# Patient Record
Sex: Male | Born: 1975 | Race: Black or African American | Hispanic: No | Marital: Single | Smoking: Never smoker
Health system: Southern US, Community
[De-identification: ages and names within clinical notes are randomized; demographics above are authoritative.]

---

## 1997-11-14 ENCOUNTER — Emergency Department (HOSPITAL_COMMUNITY): Admission: EM | Admit: 1997-11-14 | Discharge: 1997-11-14 | Payer: Self-pay | Admitting: Emergency Medicine

## 1997-11-15 ENCOUNTER — Encounter: Payer: Self-pay | Admitting: Emergency Medicine

## 1998-04-21 ENCOUNTER — Emergency Department (HOSPITAL_COMMUNITY): Admission: EM | Admit: 1998-04-21 | Discharge: 1998-04-21 | Payer: Self-pay | Admitting: Emergency Medicine

## 1999-09-14 ENCOUNTER — Emergency Department (HOSPITAL_COMMUNITY): Admission: EM | Admit: 1999-09-14 | Discharge: 1999-09-14 | Payer: Self-pay | Admitting: Emergency Medicine

## 2004-11-14 ENCOUNTER — Emergency Department (HOSPITAL_COMMUNITY): Admission: EM | Admit: 2004-11-14 | Discharge: 2004-11-15 | Payer: Self-pay | Admitting: Emergency Medicine

## 2006-12-26 ENCOUNTER — Emergency Department (HOSPITAL_COMMUNITY): Admission: EM | Admit: 2006-12-26 | Discharge: 2006-12-27 | Payer: Self-pay | Admitting: *Deleted

## 2011-05-26 ENCOUNTER — Emergency Department (HOSPITAL_COMMUNITY)
Admission: EM | Admit: 2011-05-26 | Discharge: 2011-05-26 | Disposition: A | Discharge status: Home / Self Care | Payer: Self-pay | Attending: Emergency Medicine | Admitting: Emergency Medicine

## 2011-05-26 ENCOUNTER — Encounter (HOSPITAL_COMMUNITY): Payer: Self-pay | Admitting: Family Medicine

## 2011-05-26 DIAGNOSIS — R197 Diarrhea, unspecified: Secondary | ICD-10-CM | POA: Insufficient documentation

## 2011-05-26 DIAGNOSIS — R112 Nausea with vomiting, unspecified: Secondary | ICD-10-CM | POA: Insufficient documentation

## 2011-05-26 LAB — LIPASE, BLOOD: Lipase: 18 U/L (ref 11–59)

## 2011-05-26 LAB — OCCULT BLOOD, POC DEVICE: Fecal Occult Bld: NEGATIVE

## 2011-05-26 LAB — DIFFERENTIAL
Eosinophils Absolute: 0 10*3/uL (ref 0.0–0.7)
Eosinophils Relative: 0 % (ref 0–5)
Lymphs Abs: 0.5 10*3/uL — ABNORMAL LOW (ref 0.7–4.0)
Monocytes Absolute: 0.4 10*3/uL (ref 0.1–1.0)

## 2011-05-26 LAB — COMPREHENSIVE METABOLIC PANEL
ALT: 27 U/L (ref 0–53)
BUN: 12 mg/dL (ref 6–23)
Calcium: 9 mg/dL (ref 8.4–10.5)
Creatinine, Ser: 1.02 mg/dL (ref 0.50–1.35)
GFR calc Af Amer: >90 mL/min (ref >90)
GFR calc non Af Amer: >90 mL/min (ref >90)
Glucose, Bld: 95 mg/dL (ref 70–99)
Sodium: 139 mEq/L (ref 135–145)
Total Protein: 8.2 g/dL (ref 6.0–8.3)

## 2011-05-26 LAB — CBC
HCT: 43.9 % (ref 39.0–52.0)
MCH: 28.1 pg (ref 26.0–34.0)
MCV: 83.3 fL (ref 78.0–100.0)
Platelets: 264 10*3/uL (ref 150–400)
RBC: 5.27 MIL/uL (ref 4.22–5.81)

## 2011-05-26 MED ORDER — ONDANSETRON HCL 4 MG/2ML IJ SOLN
4.0000 mg | INTRAMUSCULAR | Status: DC | PRN
  Administered 2011-05-26: 4 mg via INTRAVENOUS
  Filled 2011-05-26: qty 2

## 2011-05-26 MED ORDER — SODIUM CHLORIDE 0.9 % IV BOLUS (SEPSIS)
500.0000 mL | Freq: Once | INTRAVENOUS | Status: AC
  Administered 2011-05-26: 500 mL via INTRAVENOUS

## 2011-05-26 MED ORDER — SODIUM CHLORIDE 0.9 % IV SOLN
INTRAVENOUS | Status: DC
  Administered 2011-05-26: 15:00:00 via INTRAVENOUS

## 2011-05-26 MED ORDER — ONDANSETRON HCL 4 MG PO TABS: 4.0000 mg | ORAL_TABLET | Freq: Three times a day (TID) | ORAL | Status: AC | PRN

## 2011-05-26 MED ORDER — SODIUM CHLORIDE 0.9 % IV SOLN
80.0000 mg | Freq: Once | INTRAVENOUS | Status: AC
  Administered 2011-05-26: 80 mg via INTRAVENOUS
  Filled 2011-05-26: qty 80

## 2011-05-26 NOTE — ED Notes (Signed)
Per EMS: Pt c/o weakness and joint aches starting last night. X1 hour ago pt had 1 episode of emesis with blood.  NAD noted.

## 2011-05-26 NOTE — ED Notes (Signed)
Pt wife reports that pt s/s started with diarrhea last night, and vomiting this morning with bright red blood.  Unable to tolerate fluids. Wife reports that they're baby son was sick with vomiting and diarrhea Monday.

## 2011-05-26 NOTE — Discharge Instructions (Signed)
RESOURCE GUIDE  Dental Problems  Patients with Medicaid: [facility]                     [facility] [address]                                           [address] Phone:  [phone]                                                  Phone:  [phone]  If unable to pay or uninsured, contact:  [facility] or [facility] to become qualified for the adult dental clinic.  Chronic Pain Problems Contact [facility]  [phone] Patients need to be referred by their primary care doctor.  Insufficient Money for Medicine Contact [employer]:  call "211" or [facility] 271-5999.  No Primary Care Doctor Call [facility]  832-8000 Other agencies that provide inexpensive medical care    [facility]  [phone]    [facility]  [phone]    [facility]  [phone]    [facility] Clinic  [phone]    [facility]  [phone]    [facility]  [phone]  Psychological Services [facility]  [phone] [facility]  [phone] [facility]   [phone] (emergency services [phone])  Substance Abuse Resources [facility]  336-882-2125 [facility] 336-784-9470 [facility] 336-285-9073 [facility] 336-845-3988 Residential & Outpatient Substance Abuse Program  800-659-3381  Abuse/Neglect [place] Child Abuse Hotline (336) 641-3795 [place] Child Abuse Hotline 800-378-5315 (After Hours)  Emergency Shelter [facility] (336) 271-5985  M[facility] (336) 275-9566 [facility] (704) 372-4663  MRSA Hotline #:   832-7006    [place] [facility]  [facility] [place]     [employer]                          [facility] [address] [place]                       [address]      [address]  [place]                                                [place]                            [place] Phone:  [phone]                                   Phone:  [phone]                 Phone:  [phone]  [facility] Phone:  [phone]    [facility] Child Abuse Hotline 8488311164 (317) 840-6953 (After Hours)   Take the prescription as directed.  Increase your fluid intake (ie:  Gatoraide) for the next few days.  Eat a bland diet and advance to your regular diet slowly as you can tolerate it.   Avoid full strength juices, as well as milk and milk products until your diarrhea has resolved.   Call your regular medical doctor tomorrow to schedule a follow up appointment within the next week.  Return to the Emergency Department immediately if not improving (or even worsening) despite taking the medicines as prescribed, any black or bloody stool or vomit, if you develop a fever, or for any other concerns.

## 2011-05-26 NOTE — ED Notes (Signed)
20 gauge removed from patient's right [facility]

## 2011-05-26 NOTE — ED Provider Notes (Signed)
History     CSN: [id number]  Arrival date & time 05/26/11  1215   First MD Initiated Contact with Patient 05/26/11 1251      Chief Complaint  Patient presents with  . Hematemesis  . Nausea  . Emesis  . Diarrhea    HPI Pt was seen at 1310.  Per pt, c/o, c/o gradual onset and persistence of multiple intermittent episodes of N/V/D since last night.  Pt states he "saw blood" in his emesis after several episodes of retching today.  +child in household had same symptoms 2 days ago, dx with "GI virus" by PMD; also pt's spouse with similar symptoms.  Denies black or blood in stools, no CP/SOB, no fevers, no back pain, no abd pain.    History reviewed. No pertinent past medical history.  History reviewed. No pertinent past surgical history.  History  Substance Use Topics  . Smoking status: Not on file  . Smokeless tobacco: Not on file  . Alcohol Use: No    Review of Systems ROS: Statement: All systems negative except as marked or noted in the HPI; Constitutional: Negative for fever and chills. ; ; Eyes: Negative for eye pain, redness and discharge. ; ; ENMT: Negative for ear pain, hoarseness, nasal congestion, sinus pressure and sore throat. ; ; Cardiovascular: Negative for chest pain, palpitations, diaphoresis, dyspnea and peripheral edema. ; ; Respiratory: Negative for cough, wheezing and stridor. ; ; Gastrointestinal: +N/V/D, "blood in emesis."  Negative for abdominal pain, blood in stool, jaundice and rectal bleeding. . ; ; Genitourinary: Negative for dysuria, flank pain and hematuria. ; ; Musculoskeletal: Negative for back pain and neck pain. Negative for swelling and trauma.; ; Skin: Negative for pruritus, rash, abrasions, blisters, bruising and skin lesion.; ; Neuro: Negative for headache, lightheadedness and neck stiffness. Negative for weakness, altered level of consciousness , altered mental status, extremity weakness, paresthesias, involuntary movement, seizure and syncope.       Allergies  Review of patient's allergies indicates no known allergies.  Home Medications   Current Outpatient Rx  Name Route Sig Dispense Refill  . OVER THE COUNTER MEDICATION Oral Take 2 capsules by mouth every morning. Supplement. Muscle Ripped X.    . OVER THE COUNTER MEDICATION Oral Take 1 scoop by mouth daily. Supplement. Branched chain amino acid muscle recovery powder.      BP 123/78  Pulse 95  Temp(Src) 98.3 F (36.8 C) (Oral)  SpO2 96%  Physical Exam 1315: Physical examination:  Nursing notes reviewed; Vital signs and O2 SAT reviewed;  Constitutional: Well developed, Well nourished, Well hydrated, In no acute distress; Head:  Normocephalic, atraumatic; Eyes: EOMI, PERRL, No scleral icterus; ENMT: Mouth and pharynx normal, Mucous membranes moist; Neck: Supple, Full range of motion, No lymphadenopathy; Cardiovascular: Regular rate and rhythm, No murmur, rub, or gallop; Respiratory: Breath sounds clear & equal bilaterally, No rales, rhonchi, wheezes, or rub, Normal respiratory effort/excursion; Chest: Nontender, Movement normal; Abdomen: Soft, Nontender, Nondistended, Normal bowel sounds; Rectal exam performed w/permission of pt and ED RN chaparone present.  Anal tone normal.  Non-tender, soft brown stool in rectal vault, heme neg.  No fissures, no external hemorrhoids, no palp masses.;  Extremities: Pulses normal, No tenderness, No edema, No calf edema or asymmetry.; Neuro: AA&Ox3, Major CN grossly intact. Gait upright and steady.  No gross focal motor or sensory deficits in extremities.; Skin: Color normal, Warm, Dry, no rash.    ED Course  Procedures    MDM  MDM Reviewed: nursing  note and vitals Interpretation: labs    Results for orders placed during the hospital encounter of 05/26/11  OCCULT BLOOD, POC DEVICE      Component Value Range   Fecal Occult Bld NEGATIVE    CBC      Component Value Range   WBC 9.2  4.0 - 10.5 (K/uL)   RBC 5.27  4.22 - 5.81 (MIL/uL)    Hemoglobin 14.8  13.0 - 17.0 (g/dL)   HCT 96.0  45.4 - 09.8 (%)   MCV 83.3  78.0 - 100.0 (fL)   MCH 28.1  26.0 - 34.0 (pg)   MCHC 33.7  30.0 - 36.0 (g/dL)   RDW 11.9  14.7 - 82.9 (%)   Platelets 264  150 - 400 (K/uL)  DIFFERENTIAL      Component Value Range   Neutrophils Relative 90 (*) 43 - 77 (%)   Neutro Abs 8.3 (*) 1.7 - 7.7 (K/uL)   Lymphocytes Relative 5 (*) 12 - 46 (%)   Lymphs Abs 0.5 (*) 0.7 - 4.0 (K/uL)   Monocytes Relative 5  3 - 12 (%)   Monocytes Absolute 0.4  0.1 - 1.0 (K/uL)   Eosinophils Relative 0  0 - 5 (%)   Eosinophils Absolute 0.0  0.0 - 0.7 (K/uL)   Basophils Relative 0  0 - 1 (%)   Basophils Absolute 0.0  0.0 - 0.1 (K/uL)  COMPREHENSIVE METABOLIC PANEL      Component Value Range   Sodium 139  135 - 145 (mEq/L)   Potassium 4.5  3.5 - 5.1 (mEq/L)   Chloride 103  96 - 112 (mEq/L)   CO2 26  19 - 32 (mEq/L)   Glucose, Bld 95  70 - 99 (mg/dL)   BUN 12  6 - 23 (mg/dL)   Creatinine, Ser 5.62  0.50 - 1.35 (mg/dL)   Calcium 9.0  8.4 - 13.0 (mg/dL)   Total Protein 8.2  6.0 - 8.3 (g/dL)   Albumin 4.4  3.5 - 5.2 (g/dL)   AST 28  0 - 37 (U/L)   ALT 27  0 - 53 (U/L)   Alkaline Phosphatase 51  39 - 117 (U/L)   Total Bilirubin 0.6  0.3 - 1.2 (mg/dL)   GFR calc non Af Amer >90  >90 (mL/min)   GFR calc Af Amer >90  >90 (mL/min)  LIPASE, BLOOD      Component Value Range   Lipase 18  11 - 59 (U/L)     4:47 PM:  Feels "better" after meds and wants to go home now.  Has tol PO well while in the ED.  No N/V.  No stooling while in the ED.  No CP, no abd pain.  Abd continues benign.  VSS.  Dx testing d/w pt and family.  Questions answered.  Verb understanding, agreeable to d/c home with outpt f/u.               Laray Anger, DO 05/28/11 2148

## 2011-10-17 ENCOUNTER — Encounter (HOSPITAL_BASED_OUTPATIENT_CLINIC_OR_DEPARTMENT_OTHER): Payer: Self-pay | Admitting: *Deleted

## 2011-10-17 DIAGNOSIS — T50995A Adverse effect of other drugs, medicaments and biological substances, initial encounter: Secondary | ICD-10-CM | POA: Insufficient documentation

## 2011-10-17 DIAGNOSIS — R509 Fever, unspecified: Secondary | ICD-10-CM | POA: Insufficient documentation

## 2011-10-17 DIAGNOSIS — T887XXA Unspecified adverse effect of drug or medicament, initial encounter: Secondary | ICD-10-CM | POA: Insufficient documentation

## 2011-10-17 NOTE — ED Notes (Addendum)
C/o general weakness that started today with fevers/chills. C/o frontal h/a that started today as well. Pt. States that he took a "stamina" supplement and was concerned this is why he was feeling bad. Denies any cp/sob

## 2011-10-18 ENCOUNTER — Emergency Department (HOSPITAL_BASED_OUTPATIENT_CLINIC_OR_DEPARTMENT_OTHER)
Admission: EM | Admit: 2011-10-18 | Discharge: 2011-10-18 | Disposition: A | Discharge status: Home / Self Care | Payer: Self-pay | Attending: Emergency Medicine | Admitting: Emergency Medicine

## 2011-10-18 DIAGNOSIS — T50905A Adverse effect of unspecified drugs, medicaments and biological substances, initial encounter: Secondary | ICD-10-CM

## 2011-10-18 LAB — URINE MICROSCOPIC-ADD ON

## 2011-10-18 LAB — URINALYSIS, ROUTINE W REFLEX MICROSCOPIC
Leukocytes, UA: NEGATIVE
Nitrite: NEGATIVE
Protein, ur: 30 mg/dL — AB
Specific Gravity, Urine: 1.025 (ref 1.005–1.030)
Urobilinogen, UA: 1 mg/dL (ref 0.0–1.0)

## 2011-10-18 MED ORDER — ACETAMINOPHEN 325 MG PO TABS
650.0000 mg | ORAL_TABLET | Freq: Once | ORAL | Status: AC
  Administered 2011-10-18: 650 mg via ORAL
  Filled 2011-10-18: qty 2

## 2011-10-18 NOTE — ED Provider Notes (Signed)
History   This chart was scribed for Jay Yoder Smitty Cords, MD by Sofie Rower. The patient was seen in room MH06/MH06 and the patient's care was started at 12:27AM.     CSN: [id number]  Arrival date & time 10/17/11  2115   First MD Initiated Contact with Patient 10/18/11 0027      Chief Complaint  Patient presents with  . Weakness    (Consider location/radiation/quality/duration/timing/severity/associated sxs/prior treatment) Patient is a 36 y.o. male presenting with weakness. The history is provided by the patient. No language interpreter was used.  Weakness Primary symptoms do not include headaches, syncope, loss of consciousness, altered mental status, seizures, dizziness, visual change, paresthesias, focal weakness, loss of sensation, speech change, memory loss, fever, nausea or vomiting. The symptoms began 12 to 24 hours ago. The symptoms are unchanged. The neurological symptoms are diffuse. Context: taking a male enhancement supplement.  Additional symptoms include weakness. Additional symptoms do not include neck stiffness, pain, lower back pain, leg pain, loss of balance, photophobia, aura, hallucinations, hyperacusis, hearing loss, anxiety or irritability. Medical issues do not include seizures, cerebral vascular accident, cancer, diabetes or recent surgery. Workup history does not include MRI.  No   Jay Yoder is a 36 y.o. male  who presents to the Emergency Department complaining of  sudden, progressively worsening, weakness, onset yesterday with associated symptoms of chills, fever (99 taken at home), and headache. The pt reports that he ingested a stamina supplement yesterday evening where he began to experience sensations of fever and chills shortly thereafter.  The pt denies nasal congestion, itchy or watery eyes, diarrhea, and vomiting.   The pt does not smoke or drink alcohol.     History reviewed. No pertinent past medical history.  History reviewed. No  pertinent past surgical history.  No family history on file.  History  Substance Use Topics  . Smoking status: Never Smoker   . Smokeless tobacco: Not on file  . Alcohol Use: No      Review of Systems  Constitutional: Negative for fever and irritability.  HENT: Negative for hearing loss, ear pain, congestion, sore throat, rhinorrhea, sneezing, neck pain, neck stiffness, postnasal drip and sinus pressure.   Eyes: Negative for photophobia.  Respiratory: Negative for cough and shortness of breath.   Cardiovascular: Negative for chest pain and syncope.  Gastrointestinal: Negative for nausea and vomiting.  Skin: Negative for rash.  Neurological: Positive for weakness. Negative for dizziness, speech change, focal weakness, seizures, loss of consciousness, headaches, paresthesias and loss of balance.  Hematological: Negative for adenopathy. Does not bruise/bleed easily.  Psychiatric/Behavioral: Negative for hallucinations, memory loss, self-injury and altered mental status.  All other systems reviewed and are negative.  No tick exposure.  Started after taking some herbal male enhancement product  Allergies  Review of patient's allergies indicates no known allergies.  Home Medications   Current Outpatient Rx  Name Route Sig Dispense Refill  . ADULT MULTIVITAMIN W/MINERALS CH Oral Take 1 tablet by mouth daily.    Marland Kitchen OVER THE COUNTER MEDICATION Oral Take 1 tablet by mouth once as needed. Sexual enhancement medication      BP 131/83  Pulse 78  Temp 99.1 F (37.3 C) (Oral)  Resp 20  Ht 5\' 7"  (1.702 m)  Wt 160 lb (72.576 kg)  BMI 25.06 kg/m2  SpO2 98%  Physical Exam  Nursing note and vitals reviewed. Constitutional: He is oriented to person, place, and time. He appears well-developed and well-nourished.  HENT:  Head:  Normocephalic and atraumatic.  Right Ear: Tympanic membrane and external ear normal.  Left Ear: Tympanic membrane normal.  Nose: Nose normal.  Mouth/Throat:  Oropharynx is clear and moist. No oropharyngeal exudate.  Eyes: Conjunctivae and EOM are normal. Pupils are equal, round, and reactive to light.  Neck: Normal range of motion. Neck supple. No tracheal deviation present. No Brudzinski's sign and no Kernig's sign noted.  Cardiovascular: Normal rate, regular rhythm and normal heart sounds.   Pulmonary/Chest: Effort normal and breath sounds normal. No respiratory distress. He has no wheezes. He has no rales. He exhibits no tenderness.  Abdominal: Soft. Bowel sounds are normal. There is no tenderness.  Musculoskeletal: Normal range of motion.  Lymphadenopathy:    He has no cervical adenopathy.  Neurological: He is alert and oriented to person, place, and time. He has normal strength and normal reflexes.  Skin: Skin is warm and dry.  Psychiatric: He has a normal mood and affect. His behavior is normal.    ED Course  Procedures (including critical care time)  DIAGNOSTIC STUDIES: Oxygen Saturation is 98% on room air, normal by my interpretation.    COORDINATION OF CARE:    12:38AM- UA discussed.. Pt agrees with treatment.   Labs Reviewed - No data to display No results found.   No diagnosis found.    MDM  Medication effect.  No more using of supplements.  Hydration      I personally performed the services described in this documentation, which was scribed in my presence. The recorded information has been reviewed and considered.     Jasmine Awe, MD 10/18/11 [phone]

## 2011-10-18 NOTE — ED Notes (Signed)
Patient reports that he took stamina otc pill yesterday and since that has had congestion, chills. Low grade fever and just general feeling of malaise. Patient in no distress

## 2011-10-19 LAB — URINE CULTURE

## 2017-12-11 ENCOUNTER — Encounter (HOSPITAL_COMMUNITY): Payer: Self-pay | Admitting: *Deleted

## 2017-12-11 ENCOUNTER — Ambulatory Visit (HOSPITAL_COMMUNITY)
Admission: EM | Admit: 2017-12-11 | Discharge: 2017-12-11 | Disposition: A | Discharge status: Home / Self Care | Payer: Self-pay | Attending: Physician Assistant | Admitting: Physician Assistant

## 2017-12-11 DIAGNOSIS — M7918 Myalgia, other site: Secondary | ICD-10-CM

## 2017-12-11 MED ORDER — NAPROXEN 500 MG PO TABS: 500.0000 mg | ORAL_TABLET | Freq: Two times a day (BID) | ORAL | 0 refills | Status: AC

## 2017-12-11 MED ORDER — CYCLOBENZAPRINE HCL 10 MG PO TABS: 5.0000 mg | ORAL_TABLET | Freq: Three times a day (TID) | ORAL | 0 refills | Status: DC | PRN
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
## 2018-02-02 ENCOUNTER — Encounter (HOSPITAL_COMMUNITY): Payer: Self-pay

## 2018-02-02 DIAGNOSIS — R509 Fever, unspecified: Secondary | ICD-10-CM | POA: Diagnosis present

## 2018-02-02 DIAGNOSIS — J111 Influenza due to unidentified influenza virus with other respiratory manifestations: Secondary | ICD-10-CM | POA: Insufficient documentation

## 2018-02-02 MED ORDER — ACETAMINOPHEN 325 MG PO TABS
650.0000 mg | ORAL_TABLET | Freq: Once | ORAL | Status: AC | PRN
  Administered 2018-02-02: 650 mg via ORAL
  Filled 2018-02-02: qty 2

## 2018-02-02 NOTE — ED Triage Notes (Signed)
Pt complains of a fever for two days, he states that he vomited yesterday but not today

## 2018-02-03 ENCOUNTER — Emergency Department (HOSPITAL_COMMUNITY): Payer: Medicaid Other

## 2018-02-03 ENCOUNTER — Emergency Department (HOSPITAL_COMMUNITY)
Admission: EM | Admit: 2018-02-03 | Discharge: 2018-02-03 | Disposition: A | Discharge status: Home / Self Care | Payer: Medicaid Other | Attending: Emergency Medicine | Admitting: Emergency Medicine

## 2018-02-03 ENCOUNTER — Other Ambulatory Visit: Payer: Self-pay

## 2018-02-03 DIAGNOSIS — J111 Influenza due to unidentified influenza virus with other respiratory manifestations: Secondary | ICD-10-CM

## 2018-02-03 LAB — INFLUENZA PANEL BY PCR (TYPE A & B)
INFLBPCR: NEGATIVE
Influenza A By PCR: POSITIVE — AB

## 2018-02-03 MED ORDER — ONDANSETRON 4 MG PO TBDP
4.0000 mg | ORAL_TABLET | Freq: Once | ORAL | Status: AC
  Administered 2018-02-03: 4 mg via ORAL
  Filled 2018-02-03: qty 1

## 2018-02-03 MED ORDER — ACETAMINOPHEN 325 MG PO TABS: 650.0000 mg | ORAL_TABLET | Freq: Once | ORAL | Status: DC

## 2018-02-03 MED ORDER — IBUPROFEN 800 MG PO TABS
800.0000 mg | ORAL_TABLET | Freq: Once | ORAL | Status: AC
Start: 2018-02-03 — End: 2018-02-03
  Administered 2018-02-03: 800 mg via ORAL
  Filled 2018-02-03: qty 1

## 2018-02-03 MED ORDER — ONDANSETRON 4 MG PO TBDP: 4.0000 mg | ORAL_TABLET | Freq: Three times a day (TID) | ORAL | 0 refills | Status: DC | PRN

## 2018-02-03 MED ORDER — OSELTAMIVIR PHOSPHATE 75 MG PO CAPS: 75.0000 mg | ORAL_CAPSULE | Freq: Two times a day (BID) | ORAL | 0 refills | Status: DC

## 2018-02-03 NOTE — ED Notes (Signed)
Patient provided cup of water and instructed to notify RN if feeling nauseous/vomiting.

## 2018-02-03 NOTE — ED Provider Notes (Signed)
[facility]-EMERGENCY DEPT Provider Note   CSN: [id number] Arrival date & time: 02/02/18  2107     History   Chief Complaint Chief Complaint  Patient presents with  . Fever    HPI Jay Yoder is a 42 y.o. male.  Patient presents with a 1 day history of diffuse body aches, headache, one episode of vomiting, dry cough, runny nose, headache and body aches.  He is concerned that he could have the flu.  He does not get a flu shot.  He has been using Alka-Seltzer and Mucinex at home without relief.  He has had a poor appetite today.  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  He complains of headache and body aches.  Denies chest pain, shortness of breath, abdominal pain.  No further vomiting.  No chronic medical problems or regular medication use.  The history is provided by the patient.  Fever   Associated symptoms include vomiting, congestion, headaches and cough.    History reviewed. No pertinent past medical history.  There are no active problems to display for this patient.   History reviewed. No pertinent surgical history.      Home Medications    Prior to Admission medications   Medication Sig Start Date End Date Taking? Authorizing Provider  cyclobenzaprine (FLEXERIL) 10 MG tablet Take 0.5-1 tablets (5-10 mg total) by mouth 3 (three) times daily as needed for muscle spasms. 12/11/17   Ofilia Neaslark, Michael L, PA-C    Family History Family History  Problem Relation Age of Onset  . Healthy Mother   . Healthy Father   . Hypertension Sister     Social History Social History   Tobacco Use  . Smoking status: Never Smoker  . Smokeless tobacco: Never Used  Substance Use Topics  . Alcohol use: No  . Drug use: No     Allergies   Patient has no known allergies.   Review of Systems Review of Systems  Constitutional: Positive for activity change, appetite change, chills and fever.  HENT: Positive for congestion and  rhinorrhea.   Eyes: Negative for visual disturbance.  Respiratory: Positive for cough. Negative for chest tightness.   Gastrointestinal: Positive for nausea and vomiting. Negative for abdominal pain.  Genitourinary: Negative for dysuria, hematuria and urgency.  Musculoskeletal: Positive for arthralgias and myalgias.  Skin: Negative for rash.  Neurological: Positive for weakness and headaches.    all other systems are negative except as noted in the HPI and PMH.    Physical Exam Updated Vital Signs BP (!) 145/91 (BP Location: Left Arm)   Pulse (!) 125   Temp (!) 101.8 F (38.8 C) (Oral)   Resp (!) 22   Ht 5\' 7"  (1.702 m)   Wt 80.3 kg   SpO2 95%   BMI 27.72 kg/m   Physical Exam Vitals signs and nursing note reviewed.  Constitutional:      General: He is not in acute distress.    Appearance: He is well-developed.     Comments: Ill-appearing but nontoxic  HENT:     Head: Normocephalic and atraumatic.     Mouth/Throat:     Mouth: Mucous membranes are moist.     Pharynx: No oropharyngeal exudate.  Eyes:     Conjunctiva/sclera: Conjunctivae normal.     Pupils: Pupils are equal, round, and reactive to light.  Neck:     Musculoskeletal: Normal range of motion and neck supple. No neck rigidity.  Comments: No meningismus. Cardiovascular:     Rate and Rhythm: Regular rhythm. Tachycardia present.     Heart sounds: Normal heart sounds. No murmur.  Pulmonary:     Effort: Pulmonary effort is normal. No respiratory distress.     Breath sounds: Normal breath sounds. No wheezing.  Abdominal:     Palpations: Abdomen is soft.     Tenderness: There is no abdominal tenderness. There is no guarding or rebound.  Musculoskeletal: Normal range of motion.        General: No tenderness.  Skin:    General: Skin is warm.  Neurological:     Mental Status: He is alert and oriented to person, place, and time.     Cranial Nerves: No cranial nerve deficit.     Motor: No abnormal muscle tone.      Coordination: Coordination normal.     Comments: No ataxia on finger to nose bilaterally. No pronator drift. 5/5 strength throughout. CN 2-12 intact.Equal grip strength. Sensation intact.   Psychiatric:        Behavior: Behavior normal.      ED Treatments / Results  Labs (all labs ordered are listed, but only abnormal results are displayed) Labs Reviewed  INFLUENZA PANEL BY PCR (TYPE A & B)    EKG None  Radiology No results found.  Procedures Procedures (including critical care time)  Medications Ordered in ED Medications  ondansetron (ZOFRAN-ODT) disintegrating tablet 4 mg (has no administration in time range)  ibuprofen (ADVIL,MOTRIN) tablet 800 mg (has no administration in time range)  acetaminophen (TYLENOL) tablet 650 mg (650 mg Oral Given 02/02/18 2248)     Initial Impression / Assessment and Plan / ED Course  I have reviewed the triage vital signs and the nursing notes.  Pertinent labs & imaging results that were available during my care of the patient were reviewed by me and considered in my medical decision making (see chart for details).    2 days of headache, body aches, fever, cough, vomiting.  Suspect influenza.  Patient tachypneic, tachycardic and febrile.  Patient clinically has influenza.  He has no meningismus.  His chest x-ray is negative.  He is tolerating p.o. fluids.  He is given antipyretics, hydration.  Risks and benefits of Tamiflu discussed and accepted by patient.  Discussed oral hydration at home, antipyretics, PCP follow-up, return precautions discussed.  BP 123/73 (BP Location: Right Arm)   Pulse 99   Temp 99.5 F (37.5 C) (Oral)   Resp 20   Ht 5\' 7"  (1.702 m)   Wt 80.3 kg   SpO2 97%   BMI 27.72 kg/m   Final Clinical Impressions(s) / ED Diagnoses   Final diagnoses:  Influenza    ED Discharge Orders    None       Xareni Kelch, Jeannett SeniorStephen, MD 02/03/18 [phone]

## 2018-02-03 NOTE — Discharge Instructions (Addendum)
Keep yourself hydrated.  Use Tylenol or ibuprofen as needed for aches and fevers.  Follow-up with your doctor.  Return to the ED with new or worsening symptoms.

## 2018-02-03 NOTE — ED Notes (Signed)
Patient ambulated to x-ray.

## 2018-02-06 ENCOUNTER — Other Ambulatory Visit: Payer: Self-pay

## 2018-02-06 ENCOUNTER — Encounter (HOSPITAL_COMMUNITY): Payer: Self-pay | Admitting: Emergency Medicine

## 2018-02-06 ENCOUNTER — Emergency Department (HOSPITAL_COMMUNITY)
Admission: EM | Admit: 2018-02-06 | Discharge: 2018-02-06 | Disposition: A | Discharge status: Home / Self Care | Payer: Medicaid Other | Attending: Emergency Medicine | Admitting: Emergency Medicine

## 2018-02-06 DIAGNOSIS — J101 Influenza due to other identified influenza virus with other respiratory manifestations: Secondary | ICD-10-CM | POA: Diagnosis not present

## 2018-02-06 DIAGNOSIS — R531 Weakness: Secondary | ICD-10-CM | POA: Diagnosis present

## 2018-02-06 DIAGNOSIS — R739 Hyperglycemia, unspecified: Secondary | ICD-10-CM | POA: Diagnosis not present

## 2018-02-06 DIAGNOSIS — R030 Elevated blood-pressure reading, without diagnosis of hypertension: Secondary | ICD-10-CM | POA: Diagnosis not present

## 2018-02-06 DIAGNOSIS — R209 Unspecified disturbances of skin sensation: Secondary | ICD-10-CM

## 2018-02-06 LAB — COMPREHENSIVE METABOLIC PANEL
ALK PHOS: 36 U/L — AB (ref 38–126)
ALT: 43 U/L (ref 0–44)
AST: 43 U/L — ABNORMAL HIGH (ref 15–41)
Albumin: 3.7 g/dL (ref 3.5–5.0)
Anion gap: 12 (ref 5–15)
BUN: 9 mg/dL (ref 6–20)
CALCIUM: 8.3 mg/dL — AB (ref 8.9–10.3)
CO2: 22 mmol/L (ref 22–32)
CREATININE: 1.16 mg/dL (ref 0.61–1.24)
Chloride: 102 mmol/L (ref 98–111)
GFR calc Af Amer: >60 mL/min (ref >60)
GFR calc non Af Amer: >60 mL/min (ref >60)
Glucose, Bld: 130 mg/dL — ABNORMAL HIGH (ref 70–99)
Potassium: 3.8 mmol/L (ref 3.5–5.1)
Sodium: 136 mmol/L (ref 135–145)
TOTAL PROTEIN: 7.2 g/dL (ref 6.5–8.1)
Total Bilirubin: 1.5 mg/dL — ABNORMAL HIGH (ref 0.3–1.2)

## 2018-02-06 LAB — I-STAT CHEM 8, ED
BUN: 12 mg/dL (ref 6–20)
Calcium, Ion: 0.97 mmol/L — ABNORMAL LOW (ref 1.15–1.40)
Chloride: 101 mmol/L (ref 98–111)
Creatinine, Ser: 1 mg/dL (ref 0.61–1.24)
Glucose, Bld: 128 mg/dL — ABNORMAL HIGH (ref 70–99)
HCT: 46 % (ref 39.0–52.0)
Hemoglobin: 15.6 g/dL (ref 13.0–17.0)
Potassium: 3.6 mmol/L (ref 3.5–5.1)
Sodium: 136 mmol/L (ref 135–145)
TCO2: 28 mmol/L (ref 22–32)

## 2018-02-06 LAB — CBC
HCT: 45 % (ref 39.0–52.0)
Hemoglobin: 14.5 g/dL (ref 13.0–17.0)
MCH: 27.2 pg (ref 26.0–34.0)
MCHC: 32.2 g/dL (ref 30.0–36.0)
MCV: 84.3 fL (ref 80.0–100.0)
Platelets: 255 10*3/uL (ref 150–400)
RBC: 5.34 MIL/uL (ref 4.22–5.81)
RDW: 12.5 % (ref 11.5–15.5)
WBC: 4 10*3/uL (ref 4.0–10.5)
nRBC: 0 % (ref 0.0–0.2)

## 2018-02-06 LAB — DIFFERENTIAL
Abs Immature Granulocytes: 0.01 10*3/uL (ref 0.00–0.07)
Basophils Absolute: 0 10*3/uL (ref 0.0–0.1)
Basophils Relative: 1 %
Eosinophils Absolute: 0 10*3/uL (ref 0.0–0.5)
Eosinophils Relative: 0 %
Immature Granulocytes: 0 %
LYMPHS ABS: 1.5 10*3/uL (ref 0.7–4.0)
Lymphocytes Relative: 37 %
Monocytes Absolute: 0.5 10*3/uL (ref 0.1–1.0)
Monocytes Relative: 13 %
Neutro Abs: 1.9 10*3/uL (ref 1.7–7.7)
Neutrophils Relative %: 49 %

## 2018-02-06 MED ORDER — SODIUM CHLORIDE 0.9 % IV BOLUS
1000.0000 mL | Freq: Once | INTRAVENOUS | Status: AC
  Administered 2018-02-06: 1000 mL via INTRAVENOUS

## 2018-02-06 NOTE — ED Triage Notes (Addendum)
Pt reports numbness and weakness to L arm and bilateral legs x 2 hours.  No arm drift.  No leg drift.  Diagnosed with flu on Thursday.  Ambulatory to triage with steady gait.  No slurred speech.  No neuro deficits noted on triage exam.

## 2018-02-06 NOTE — ED Provider Notes (Signed)
[facility] Note   CSN: [id number] Arrival date & time: 02/06/18  0244     History   Chief Complaint Chief Complaint  Patient presents with  . Numbness  . Weakness    HPI Jay Yoder is a 42 y.o. male who was diagnosed with Influenza A on 02/03/2018. The patient is currently taking Tamifllu. The patient states that tonight he got "a sensation in my legs like they were weak or numb." He states  That it occurred from the knees down on both sides and is unsure of how long it lasted. He thinks that he "might have had a slight twinge of numbness in the left hand for a second" He denies any other neurologic symptoms. He denies actual weakness or inability to move and he has no symptoms at this time.  HPI  History reviewed. No pertinent past medical history.  There are no active problems to display for this patient.   History reviewed. No pertinent surgical history.      Home Medications    Prior to Admission medications   Medication Sig Start Date End Date Taking? Authorizing Provider  cyclobenzaprine (FLEXERIL) 10 MG tablet Take 0.5-1 tablets (5-10 mg total) by mouth 3 (three) times daily as needed for muscle spasms. Patient not taking: Reported on 02/03/2018 12/11/17   Ofilia Neaslark, Michael L, PA-C  ondansetron (ZOFRAN ODT) 4 MG disintegrating tablet Take 1 tablet (4 mg total) by mouth every 8 (eight) hours as needed for nausea or vomiting. 02/03/18   Rancour, Jeannett SeniorStephen, MD  oseltamivir (TAMIFLU) 75 MG capsule Take 1 capsule (75 mg total) by mouth every 12 (twelve) hours. 02/03/18   Glynn Octaveancour, Stephen, MD    Family History Family History  Problem Relation Age of Onset  . Healthy Mother   . Healthy Father   . Hypertension Sister     Social History Social History   Tobacco Use  . Smoking status: Never Smoker  . Smokeless tobacco: Never Used  Substance Use Topics  . Alcohol use: No  . Drug use: No     Allergies     Patient has no known allergies.   Review of Systems Review of Systems Ten systems reviewed and are negative for acute change, except as noted in the HPI.    Physical Exam Updated Vital Signs BP (!) 152/105   Pulse 97   Temp 98.3 F (36.8 C) (Oral)   Resp 16   SpO2 94%   Physical Exam Vitals signs and nursing note reviewed.  Constitutional:      General: He is not in acute distress.    Appearance: He is well-developed. He is not diaphoretic.  HENT:     Head: Normocephalic and atraumatic.  Eyes:     General: No scleral icterus.    Conjunctiva/sclera: Conjunctivae normal.  Neck:     Musculoskeletal: Normal range of motion and neck supple.  Cardiovascular:     Rate and Rhythm: Normal rate and regular rhythm.     Heart sounds: Normal heart sounds.  Pulmonary:     Effort: Pulmonary effort is normal. No respiratory distress.     Breath sounds: Normal breath sounds.  Abdominal:     Palpations: Abdomen is soft.     Tenderness: There is no abdominal tenderness.  Skin:    General: Skin is warm and dry.  Neurological:     Mental Status: He is alert.     Deep Tendon Reflexes:     Reflex  Scores:      Patellar reflexes are 2+ on the right side and 2+ on the left side.      Achilles reflexes are 2+ on the right side and 2+ on the left side.    Comments: Speech is clear and goal oriented, follows commands Major Cranial nerves without deficit, no facial droop Normal strength in upper and lower extremities bilaterally including dorsiflexion and plantar flexion, strong and equal grip strength Sensation normal to light and sharp touch Moves extremities without ataxia, coordination intact Normal finger to nose and rapid alternating movements Neg romberg, no pronator drift Normal gait Normal heel-shin and balance   Psychiatric:        Behavior: Behavior normal.      ED Treatments / Results  Labs (all labs ordered are listed, but only abnormal results are displayed) Labs  Reviewed  CBC  DIFFERENTIAL  COMPREHENSIVE METABOLIC PANEL  URINALYSIS, ROUTINE W REFLEX MICROSCOPIC  I-STAT CHEM 8, ED    EKG EKG Interpretation  Date/Time:  Monday February 06 2018 02:53:55 EST Ventricular Rate:  100 PR Interval:  122 QRS Duration: 78 QT Interval:  348 QTC Calculation: 448 R Axis:   29 Text Interpretation:  Normal sinus rhythm Nonspecific ST abnormality Abnormal ECG Confirmed by Zadie RhineWickline, Donald ([id number]) on 02/06/2018 3:12:06 AM   Radiology No results found.  Procedures Procedures (including critical care time)  Medications Ordered in ED Medications  sodium chloride 0.9 % bolus 1,000 mL (has no administration in time range)     Initial Impression / Assessment and Plan / ED Course  I have reviewed the triage vital signs and the nursing notes.  Pertinent labs & imaging results that were available during my care of the patient were reviewed by me and considered in my medical decision making (see chart for details).    42 year old male with recent diagnosis of influenza A.  Lab work-up shows mildly elevated hyperglycemia, he has an elevated blood pressure reading here today.  Patient has significant difficulty scribing his symptoms however with bilateral stocking distribution of potential numbness although it is difficult to gauge his actual sensation I have very low suspicion for TIA.  Patient has reflexes so Guyon Barr syndrome is highly unlikely.  I doubt any emergent cause of his abnormal sensations today.  Labs are reviewed and patient appears appropriate for discharge at this time.  Final Clinical Impressions(s) / ED Diagnoses   Final diagnoses:  Influenza A  Abnormal sensation of leg  Hyperglycemia  Elevated blood pressure reading    ED Discharge Orders    None       Arthor CaptainHarris, Shloma Roggenkamp, PA-C 02/06/18 [id number]    Zadie RhineWickline, Donald, MD 02/06/18 [phone]

## 2018-02-06 NOTE — Discharge Instructions (Addendum)
Follow up with a primary cared doctor for recheck of your blood sugar and blood pressure after your flu symptoms have resolved. Return to the emergency department if you experience changes in vision, difficulty with speech or swallowing, facial droop, weakness of one side of your body, inability to walk, foot drop or any new or concerning symptoms.

## 2018-04-17 ENCOUNTER — Other Ambulatory Visit: Payer: Self-pay

## 2018-04-17 ENCOUNTER — Ambulatory Visit (HOSPITAL_COMMUNITY)
Admission: EM | Admit: 2018-04-17 | Discharge: 2018-04-17 | Disposition: A | Discharge status: Home / Self Care | Payer: Medicaid Other | Attending: Emergency Medicine | Admitting: Emergency Medicine

## 2018-04-17 ENCOUNTER — Encounter (HOSPITAL_COMMUNITY): Payer: Self-pay

## 2018-04-17 DIAGNOSIS — J321 Chronic frontal sinusitis: Secondary | ICD-10-CM | POA: Diagnosis not present

## 2018-04-17 DIAGNOSIS — R05 Cough: Secondary | ICD-10-CM

## 2018-04-17 DIAGNOSIS — R059 Cough, unspecified: Secondary | ICD-10-CM

## 2018-04-17 MED ORDER — PREDNISONE 10 MG (21) PO TBPK: ORAL_TABLET | Freq: Every day | ORAL | 0 refills | Status: DC

## 2018-04-17 MED ORDER — DEXTROMETHORPHAN HBR 15 MG/5ML PO SYRP: 10.0000 mL | ORAL_SOLUTION | Freq: Four times a day (QID) | ORAL | 0 refills | Status: DC | PRN

## 2018-04-17 NOTE — ED Provider Notes (Signed)
[facility]    CSN: [id number] Arrival date & time: 04/17/18  0830     History   Chief Complaint Chief Complaint  Patient presents with  . Cough    HPI Jay Yoder is a 43 y.o. male.   Cough for 2 weeks since being tested positive for flu. State that he is feeling better since then except the cough is not going away. Also noticed some nasal pressure and clear drainage. No frontal pain or headache. No n/v/d has not taken anything pta      History reviewed. No pertinent past medical history.  There are no active problems to display for this patient.   History reviewed. No pertinent surgical history.     Home Medications    Prior to Admission medications   Medication Sig Start Date End Date Taking? Authorizing Provider  cyclobenzaprine (FLEXERIL) 10 MG tablet Take 0.5-1 tablets (5-10 mg total) by mouth 3 (three) times daily as needed for muscle spasms. Patient not taking: Reported on 02/03/2018 12/11/17   Ofilia Neas, PA-C  dextromethorphan 15 MG/5ML syrup Take 10 mLs (30 mg total) by mouth 4 (four) times daily as needed for cough. 04/17/18   Coralyn Mark, NP  ondansetron (ZOFRAN ODT) 4 MG disintegrating tablet Take 1 tablet (4 mg total) by mouth every 8 (eight) hours as needed for nausea or vomiting. 02/03/18   Rancour, Jeannett Senior, MD  oseltamivir (TAMIFLU) 75 MG capsule Take 1 capsule (75 mg total) by mouth every 12 (twelve) hours. 02/03/18   Rancour, Jeannett Senior, MD  predniSONE (STERAPRED UNI-PAK 21 TAB) 10 MG (21) TBPK tablet Take by mouth daily. Take 6 tabs by mouth daily  for 2 days, then 5 tabs for 2 days, then 4 tabs for 2 days, then 3 tabs for 2 days, 2 tabs for 2 days, then 1 tab by mouth daily for 2 days 04/17/18   Coralyn Mark, NP    Family History Family History  Problem Relation Age of Onset  . Healthy Mother   . Healthy Father   . Hypertension Sister     Social History Social History   Tobacco Use  . Smoking status: Never  Smoker  . Smokeless tobacco: Never Used  Substance Use Topics  . Alcohol use: No  . Drug use: No     Allergies   Patient has no known allergies.   Review of Systems Review of Systems  Constitutional: Negative.   HENT: Positive for postnasal drip and sinus pressure.   Eyes: Negative.   Respiratory: Positive for cough.   Gastrointestinal: Negative.   Genitourinary: Negative.   Musculoskeletal: Negative.   Skin: Negative.   Neurological: Negative.      Physical Exam Triage Vital Signs ED Triage Vitals [04/17/18 0930]  Enc Vitals Group     BP      Pulse      Resp      Temp      Temp src      SpO2      Weight 175 lb (79.4 kg)     Height      Head Circumference      Peak Flow      Pain Score 5     Pain Loc      Pain Edu?      Excl. in GC?    No data found.  Updated Vital Signs Wt 175 lb (79.4 kg)   BMI 27.41 kg/m   Visual Acuity  Physical Exam HENT:     Right Ear: Tympanic membrane normal.     Left Ear: Tympanic membrane normal.     Nose: Congestion present.     Comments: Clear post nasal drip  Eyes:     Pupils: Pupils are equal, round, and reactive to light.  Neck:     Musculoskeletal: Normal range of motion.  Cardiovascular:     Rate and Rhythm: Normal rate.  Pulmonary:     Effort: Pulmonary effort is normal.  Abdominal:     General: Abdomen is flat.  Skin:    General: Skin is warm.  Neurological:     General: No focal deficit present.     Mental Status: He is alert.      UC Treatments / Results  Labs (all labs ordered are listed, but only abnormal results are displayed) Labs Reviewed - No data to display  EKG None  Radiology No results found.  Procedures Procedures (including critical care time)  Medications Ordered in UC Medications - No data to display  Initial Impression / Assessment and Plan / UC Course  I have reviewed the triage vital signs and the nursing notes.  Pertinent labs & imaging results that were  available during my care of the patient were reviewed by me and considered in my medical decision making (see chart for details).     Take cough meds as needed  Take zyrtec daily  Stay hydrated   Final Clinical Impressions(s) / UC Diagnoses   Final diagnoses:  Cough  Chronic frontal sinusitis     Discharge Instructions     Take cough meds as needed  Take zyrtec daily  Stay hydrated     ED Prescriptions    Medication Sig Dispense Auth. Provider   predniSONE (STERAPRED UNI-PAK 21 TAB) 10 MG (21) TBPK tablet Take by mouth daily. Take 6 tabs by mouth daily  for 2 days, then 5 tabs for 2 days, then 4 tabs for 2 days, then 3 tabs for 2 days, 2 tabs for 2 days, then 1 tab by mouth daily for 2 days 42 tablet Maple Mirza L, NP   dextromethorphan 15 MG/5ML syrup Take 10 mLs (30 mg total) by mouth 4 (four) times daily as needed for cough. 120 mL Coralyn Mark, NP     Controlled Substance Prescriptions [place] Controlled Substance Registry consulted? Not Applicable   Coralyn Mark, NP 04/17/18 [phone]

## 2018-04-17 NOTE — ED Triage Notes (Signed)
Pt cc nasal congestion and cough x 3 weeks or more.

## 2018-04-17 NOTE — Discharge Instructions (Addendum)
Take cough meds as needed  Take zyrtec daily  Stay hydrated

## 2018-04-26 ENCOUNTER — Encounter (HOSPITAL_COMMUNITY): Payer: Self-pay | Admitting: Emergency Medicine

## 2018-04-26 ENCOUNTER — Other Ambulatory Visit: Payer: Self-pay

## 2018-04-26 ENCOUNTER — Ambulatory Visit (HOSPITAL_COMMUNITY)
Admission: EM | Admit: 2018-04-26 | Discharge: 2018-04-26 | Disposition: A | Discharge status: Home / Self Care | Payer: Medicaid Other | Attending: Family Medicine | Admitting: Family Medicine

## 2018-04-26 DIAGNOSIS — K1379 Other lesions of oral mucosa: Secondary | ICD-10-CM

## 2018-04-26 MED ORDER — PENICILLIN V POTASSIUM 500 MG PO TABS: 500.0000 mg | ORAL_TABLET | Freq: Three times a day (TID) | ORAL | 0 refills | Status: DC

## 2018-04-26 NOTE — ED Triage Notes (Signed)
Pt c/o swelling in his gums woke up this morning with it. No tooth pain.

## 2018-04-27 NOTE — ED Provider Notes (Signed)
[facility]   [id number] 04/26/18 Arrival Time: 1115  ASSESSMENT & PLAN:  1. Mucocele of mouth    No sign of abscess requiring I&D at this time. Discussed.  Discussed this is very likely a mucocele and should resolve gradually. Given Rx for Veetid to begin taking if he notes increased swelling and pain over the next 48 hours.  Meds ordered this encounter  Medications  . penicillin v potassium (VEETID) 500 MG tablet    Sig: Take 1 tablet (500 mg total) by mouth 3 (three) times daily.    Dispense:  30 tablet    Refill:  0   May f/u here as needed.  Reviewed expectations re: course of current medical issues. Questions answered. Outlined signs and symptoms indicating need for more acute intervention. Patient verbalized understanding. After Visit Summary given.   SUBJECTIVE:  Jay Yoder is a 43 y.o. male who reports noticing and area of swelling on his inner lower left gum this morning. No pain. No injury to area. Fever: absent. Tolerating PO intake; no pain with chewing. Normal swallowing. He does not see a dentist regularly. No neck swelling or pain. No home/OTC tx. No h/o similar. No specific aggravating or alleviating factors reported.  ROS: As per HPI.  OBJECTIVE: Vitals:   04/26/18 1155  BP: (!) 134/93  Pulse: 98  Resp: 16  Temp: 98.8 F (37.1 C)  SpO2: 97%    General appearance: alert; no distress HENT: normocephalic; atraumatic; dentition: good; left lower inner gum with an approx 5 mm dome-shaped, translucent area of superficial swelling that is fluctuant but without pain; without drainage, or bleeding; normal jaw movement without difficulty Neck: supple without LAD; FROM; trachea midline Lungs: normal respirations; unlabored Skin: warm and dry Psychological: alert and cooperative; normal mood and affect  No Known Allergies   Social History   Socioeconomic History  . Marital status: Single    Spouse name: Not on file  . Number of  children: Not on file  . Years of education: Not on file  . Highest education level: Not on file  Occupational History  . Not on file  Social Needs  . Financial resource strain: Not on file  . Food insecurity:    Worry: Not on file    Inability: Not on file  . Transportation needs:    Medical: Not on file    Non-medical: Not on file  Tobacco Use  . Smoking status: Never Smoker  . Smokeless tobacco: Never Used  Substance and Sexual Activity  . Alcohol use: No  . Drug use: No  . Sexual activity: Not on file  Lifestyle  . Physical activity:    Days per week: Not on file    Minutes per session: Not on file  . Stress: Not on file  Relationships  . Social connections:    Talks on phone: Not on file    Gets together: Not on file    Attends religious service: Not on file    Active member of club or organization: Not on file    Attends meetings of clubs or organizations: Not on file    Relationship status: Not on file  . Intimate partner violence:    Fear of current or ex partner: Not on file    Emotionally abused: Not on file    Physically abused: Not on file    Forced sexual activity: Not on file  Other Topics Concern  . Not on file  Social History Narrative  .  Not on file   Family History  Problem Relation Age of Onset  . Healthy Mother   . Healthy Father   . Hypertension Sister    History reviewed. No pertinent surgical history.   Mardella Layman, MD 04/27/18 [phone]

## 2018-08-24 ENCOUNTER — Emergency Department (HOSPITAL_COMMUNITY): Payer: Medicaid Other

## 2018-08-24 ENCOUNTER — Encounter (HOSPITAL_COMMUNITY): Payer: Self-pay | Admitting: Emergency Medicine

## 2018-08-24 ENCOUNTER — Emergency Department (HOSPITAL_COMMUNITY)
Admission: EM | Admit: 2018-08-24 | Discharge: 2018-08-24 | Disposition: A | Discharge status: Home / Self Care | Payer: Medicaid Other | Attending: Emergency Medicine | Admitting: Emergency Medicine

## 2018-08-24 ENCOUNTER — Other Ambulatory Visit: Payer: Self-pay

## 2018-08-24 DIAGNOSIS — U071 COVID-19: Secondary | ICD-10-CM | POA: Diagnosis not present

## 2018-08-24 DIAGNOSIS — R0600 Dyspnea, unspecified: Secondary | ICD-10-CM | POA: Diagnosis present

## 2018-08-24 DIAGNOSIS — J069 Acute upper respiratory infection, unspecified: Secondary | ICD-10-CM

## 2018-08-24 MED ORDER — DEXAMETHASONE 4 MG PO TABS: 12.0000 mg | ORAL_TABLET | Freq: Once | ORAL | Status: DC

## 2018-08-24 MED ORDER — ALBUTEROL SULFATE HFA 108 (90 BASE) MCG/ACT IN AERS
2.0000 | INHALATION_SPRAY | Freq: Once | RESPIRATORY_TRACT | Status: AC
  Administered 2018-08-24: 2 via RESPIRATORY_TRACT
  Filled 2018-08-24: qty 6.7

## 2018-08-24 MED ORDER — DEXAMETHASONE 6 MG PO TABS: 6.0000 mg | ORAL_TABLET | Freq: Every day | ORAL | 0 refills | Status: DC

## 2018-08-24 MED ORDER — DEXAMETHASONE 4 MG PO TABS
6.0000 mg | ORAL_TABLET | Freq: Once | ORAL | Status: AC
  Administered 2018-08-24: 19:00:00 6 mg via ORAL
  Filled 2018-08-24: qty 2

## 2018-08-24 NOTE — Discharge Instructions (Signed)
Your oxygen level is low and you are needing additional oxygen to keep you at an acceptable level. My recommendation is that you be hospitalized but you are being discharged per your wishes. Please continue to keep a close eye on your oxygen level. Take medications as prescribed. Please return to the hospital if the levels continue to drop or you feel worse.      Person Under Monitoring Name: Jay Yoder  Location: [place] [address]   Infection Prevention Recommendations for Individuals Confirmed to have, or Being Evaluated for, 2019 Novel Coronavirus (COVID-19) Infection Who Receive Care at Home  Individuals who are confirmed to have, or are being evaluated for, COVID-19 should follow the prevention steps below until a healthcare provider or local or state health department says they can return to normal activities.  Stay home except to get medical care You should restrict activities outside your home, except for getting medical care. Do not go to work, school, or public areas, and do not use public transportation or taxis.  Call ahead before visiting your doctor Before your medical appointment, call the healthcare provider and tell them that you have, or are being evaluated for, COVID-19 infection. This will help the healthcare providers office take steps to keep other people from getting infected. Ask your healthcare provider to call the local or state health department.  Monitor your symptoms Seek prompt medical attention if your illness is worsening (e.g., difficulty breathing). Before going to your medical appointment, call the healthcare provider and tell them that you have, or are being evaluated for, COVID-19 infection. Ask your healthcare provider to call the local or state health department.  Wear a facemask You should wear a facemask that covers your nose and mouth when you are in the same room with other people and when you visit a healthcare  provider. People who live with or visit you should also wear a facemask while they are in the same room with you.  Separate yourself from other people in your home As much as possible, you should stay in a different room from other people in your home. Also, you should use a separate bathroom, if available.  Avoid sharing household items You should not share dishes, drinking glasses, cups, eating utensils, towels, bedding, or other items with other people in your home. After using these items, you should wash them thoroughly with soap and water.  Cover your coughs and sneezes Cover your mouth and nose with a tissue when you cough or sneeze, or you can cough or sneeze into your sleeve. Throw used tissues in a lined trash can, and immediately wash your hands with soap and water for at least 20 seconds or use an alcohol-based hand rub.  Wash your [facility] your hands often and thoroughly with soap and water for at least 20 seconds. You can use an alcohol-based hand sanitizer if soap and water are not available and if your hands are not visibly dirty. Avoid touching your eyes, nose, and mouth with unwashed hands.   Prevention Steps for Caregivers and Household Members of Individuals Confirmed to have, or Being Evaluated for, COVID-19 Infection Being Cared for in the Home  If you live with, or provide care at home for, a person confirmed to have, or being evaluated for, COVID-19 infection please follow these guidelines to prevent infection:  Follow healthcare providers instructions Make sure that you understand and can help the patient follow any healthcare provider instructions for all care.  Provide for the patients basic needs You should help the patient with basic needs in the home and provide support for getting groceries, prescriptions, and other personal needs.  Monitor the patients symptoms If they are getting sicker, call his or her medical provider and tell them that the  patient has, or is being evaluated for, COVID-19 infection. This will help the healthcare providers office take steps to keep other people from getting infected. Ask the healthcare provider to call the local or state health department.  Limit the number of people who have contact with the patient If possible, have only one caregiver for the patient. Other household members should stay in another home or place of residence. If this is not possible, they should stay in another room, or be separated from the patient as much as possible. Use a separate bathroom, if available. Restrict visitors who do not have an essential need to be in the home.  Keep older adults, very young children, and other sick people away from the patient Keep older adults, very young children, and those who have compromised immune systems or chronic health conditions away from the patient. This includes people with chronic heart, lung, or kidney conditions, diabetes, and cancer.  Ensure good ventilation Make sure that shared spaces in the home have good air flow, such as from an air conditioner or an opened window, weather permitting.  Wash your hands often Wash your hands often and thoroughly with soap and water for at least 20 seconds. You can use an alcohol based hand sanitizer if soap and water are not available and if your hands are not visibly dirty. Avoid touching your eyes, nose, and mouth with unwashed hands. Use disposable paper towels to dry your hands. If not available, use dedicated cloth towels and replace them when they become wet.  Wear a facemask and gloves Wear a disposable facemask at all times in the room and gloves when you touch or have contact with the patients blood, body fluids, and/or secretions or excretions, such as sweat, saliva, sputum, nasal mucus, vomit, urine, or feces.  Ensure the mask fits over your nose and mouth tightly, and do not touch it during use. Throw out disposable facemasks  and gloves after using them. Do not reuse. Wash your hands immediately after removing your facemask and gloves. If your personal clothing becomes contaminated, carefully remove clothing and launder. Wash your hands after handling contaminated clothing. Place all used disposable facemasks, gloves, and other waste in a lined container before disposing them with other household waste. Remove gloves and wash your hands immediately after handling these items.  Do not share dishes, glasses, or other household items with the patient Avoid sharing household items. You should not share dishes, drinking glasses, cups, eating utensils, towels, bedding, or other items with a patient who is confirmed to have, or being evaluated for, COVID-19 infection. After the person uses these items, you should wash them thoroughly with soap and water.  Wash laundry thoroughly Immediately remove and wash clothes or bedding that have blood, body fluids, and/or secretions or excretions, such as sweat, saliva, sputum, nasal mucus, vomit, urine, or feces, on them. Wear gloves when handling laundry from the patient. Read and follow directions on labels of laundry or clothing items and detergent. In general, wash and dry with the warmest temperatures recommended on the label.  Clean all areas the individual has used often Clean all touchable surfaces, such as counters, tabletops, doorknobs, bathroom fixtures, toilets, phones, keyboards, tablets,  and bedside tables, every day. Also, clean any surfaces that may have blood, body fluids, and/or secretions or excretions on them. Wear gloves when cleaning surfaces the patient has come in contact with. Use a diluted bleach solution (e.g., dilute bleach with 1 part bleach and 10 parts water) or a household disinfectant with a label that says EPA-registered for coronaviruses. To make a bleach solution at home, add 1 tablespoon of bleach to 1 quart (4 cups) of water. For a larger supply,  add  cup of bleach to 1 gallon (16 cups) of water. Read labels of cleaning products and follow recommendations provided on product labels. Labels contain instructions for safe and effective use of the cleaning product including precautions you should take when applying the product, such as wearing gloves or eye protection and making sure you have good ventilation during use of the product. Remove gloves and wash hands immediately after cleaning.  Monitor yourself for signs and symptoms of illness Caregivers and household members are considered close contacts, should monitor their health, and will be asked to limit movement outside of the home to the extent possible. Follow the monitoring steps for close contacts listed on the symptom monitoring form.   ? If you have additional questions, contact your local health department or call the epidemiologist on call at [phone] (available 24/7). ? This guidance is subject to change. For the most up-to-date guidance from [facility], please refer to their website: YouBlogs.pl

## 2018-08-24 NOTE — ED Notes (Signed)
Pt placed on 1L O2 due to sats remaining at 90% on RA.

## 2018-08-24 NOTE — ED Provider Notes (Signed)
[facility] Provider Note   CSN: [id number] Arrival date & time: 08/24/18  1646     History   Chief Complaint No chief complaint on file.   HPI Jay Yoder is a 43 y.o. male.     HPI   42yM with dyspnea. Says he was tested positive for SARS-CoV-2 on 08/15/18. His symptoms then were feeling very fatigued and he had a known contact at work. Fatigue persisted with little additional symptoms until yesterday when he began to feel SOB. He got a pulse oximeter and has been checking his levels. He says it has been running mostly around 90% and the low he has seen it is 85%. Has felt nauseated. No vomiting but hasn't had much of an appetite. No diarrhea. Occasional nonproductive cough. Otherwise healthy as far as he is aware.   History reviewed. No pertinent past medical history.  There are no active problems to display for this patient.   History reviewed. No pertinent surgical history.      Home Medications    Prior to Admission medications   Medication Sig Start Date End Date Taking? Authorizing Provider  cyclobenzaprine (FLEXERIL) 10 MG tablet Take 0.5-1 tablets (5-10 mg total) by mouth 3 (three) times daily as needed for muscle spasms. Patient not taking: Reported on 02/03/2018 12/11/17   Ofilia Neaslark, Michael L, PA-C  dexamethasone (DECADRON) 6 MG tablet Take 1 tablet (6 mg total) by mouth daily. 08/24/18   Raeford RazorKohut, Luisdaniel Kenton, MD  dextromethorphan 15 MG/5ML syrup Take 10 mLs (30 mg total) by mouth 4 (four) times daily as needed for cough. Patient not taking: Reported on 04/26/2018 04/17/18   Coralyn MarkMitchell, Melanie L, NP  ondansetron (ZOFRAN ODT) 4 MG disintegrating tablet Take 1 tablet (4 mg total) by mouth every 8 (eight) hours as needed for nausea or vomiting. Patient not taking: Reported on 04/26/2018 02/03/18   Glynn Octaveancour, Roshunda Keir, MD  oseltamivir (TAMIFLU) 75 MG capsule Take 1 capsule (75 mg total) by mouth every 12 (twelve) hours. 02/03/18   Rancour,  Jeannett SeniorStephen, MD  penicillin v potassium (VEETID) 500 MG tablet Take 1 tablet (500 mg total) by mouth 3 (three) times daily. 04/26/18   Mardella LaymanHagler, Brian, MD  predniSONE (STERAPRED UNI-PAK 21 TAB) 10 MG (21) TBPK tablet Take by mouth daily. Take 6 tabs by mouth daily  for 2 days, then 5 tabs for 2 days, then 4 tabs for 2 days, then 3 tabs for 2 days, 2 tabs for 2 days, then 1 tab by mouth daily for 2 days Patient not taking: Reported on 04/26/2018 04/17/18   Coralyn MarkMitchell, Melanie L, NP    Family History Family History  Problem Relation Age of Onset  . Healthy Mother   . Healthy Father   . Hypertension Sister     Social History Social History   Tobacco Use  . Smoking status: Never Smoker  . Smokeless tobacco: Never Used  Substance Use Topics  . Alcohol use: No  . Drug use: No     Allergies   Patient has no known allergies.   Review of Systems Review of Systems  All systems reviewed and negative, other than as noted in HPI.  Physical Exam Updated Vital Signs BP (!) 141/93 (BP Location: Right Arm)   Pulse (!) 115   Temp 99.5 F (37.5 C) (Oral)   Resp 16   SpO2 94%   Physical Exam Vitals signs and nursing note reviewed.  Constitutional:      General: He is  not in acute distress.    Appearance: He is well-developed.     Comments: Laying in bed. Appears well.   HENT:     Head: Normocephalic and atraumatic.  Eyes:     General:        Right eye: No discharge.        Left eye: No discharge.     Conjunctiva/sclera: Conjunctivae normal.  Neck:     Musculoskeletal: Neck supple.  Cardiovascular:     Rate and Rhythm: Regular rhythm. Tachycardia present.     Heart sounds: Normal heart sounds. No murmur. No friction rub. No gallop.   Pulmonary:     Effort: Pulmonary effort is normal. No respiratory distress.     Breath sounds: Normal breath sounds.  Abdominal:     General: There is no distension.     Palpations: Abdomen is soft.     Tenderness: There is no abdominal tenderness.   Musculoskeletal:        General: No tenderness.  Skin:    General: Skin is warm and dry.  Neurological:     Mental Status: He is alert.  Psychiatric:        Behavior: Behavior normal.        Thought Content: Thought content normal.      ED Treatments / Results  Labs (all labs ordered are listed, but only abnormal results are displayed) Labs Reviewed - No data to display  EKG EKG Interpretation  Date/Time:  Thursday August 24 2018 16:58:28 EDT Ventricular Rate:  115 PR Interval:    QRS Duration: 78 QT Interval:  315 QTC Calculation: 436 R Axis:   69 Text Interpretation:  Sinus tachycardia Consider left ventricular hypertrophy Confirmed by Virgel Manifold [phone]) on 08/24/2018 5:31:03 PM   Radiology Dg Chest Portable 1 View  Result Date: 08/24/2018 CLINICAL DATA:  Dyspnea. Shortness of breath. Positive for COVID-19 EXAM: PORTABLE CHEST 1 VIEW COMPARISON:  02/03/2018 FINDINGS: 1728 hours. Low lung volumes. Patchy peripheral airspace disease noted bilaterally, left greater than right with a lower lung predominance. The cardiopericardial silhouette is within normal limits for size. The visualized bony structures of the thorax are intact. Telemetry leads overlie the chest. IMPRESSION: Patchy airspace disease, right greater than left with a peripheral and lower lobe predominance. Imaging features compatible with reported history of COVID-19 infection. Electronically Signed   By: Misty Stanley M.D.   On: 08/24/2018 18:08    Procedures Procedures (including critical care time)  Medications Ordered in ED Medications  dexamethasone (DECADRON) tablet 12 mg (has no administration in time range)  albuterol (VENTOLIN HFA) 108 (90 Base) MCG/ACT inhaler 2 puff (2 puffs Inhalation Given 08/24/18 1802)     Initial Impression / Assessment and Plan / ED Course  I have reviewed the triage vital signs and the nursing notes.  Pertinent labs & imaging results that were available during my care  of the patient were reviewed by me and considered in my medical decision making (see chart for details).   Pt has known recently diagnosed SARS-CoV-2 infection and now with hypoxemia. CXR is consistent with this. Pt is insistent on discharge. Says he just wants an inhaler and to go home.  I advised admission to the hospital. He has an oxygen requirement. He is declining. He has medical decision making capability and understands the benefit of hospitalization and risks including, but not limited to, worsening respiratory status, need for mechanical ventilation and death.  I advised him to keep a close eye on  his pulse oximeter and return is it continues to drop or his symptoms worsen.   The [employer] COVID-19 Treatment Guidelines Panel Recommendations for Dexamethasone in Patients with COVID-19 is 6 mg daily for up to 10 days in patients that are mechanically ventilated or for patients that require supplemental oxygen. Pt does need some supplemental oxygen although low requirement currently. Provided with a prescription for decadron. Will provide him with an inhaler although I cautioned about not getting a false sense of security and that it may not help significantly with this disease process. Re-iterated to return to the ER immediately for any progression of symptoms.   Jay Yoder was evaluated in Emergency Department on 08/24/2018 for the symptoms described in the history of present illness. He was evaluated in the context of the global COVID-19 pandemic, which necessitated consideration that the patient might be at risk for infection with the SARS-CoV-2 virus that causes COVID-19. Institutional protocols and algorithms that pertain to the evaluation of patients at risk for COVID-19 are in a state of rapid change based on information released by regulatory bodies including the CDC and federal and state organizations. These policies and algorithms were followed during the patient's  care in the ED.   Final Clinical Impressions(s) / ED Diagnoses   Final diagnoses:  Acute respiratory disease due to COVID-19 virus    ED Discharge Orders         Ordered    dexamethasone (DECADRON) 6 MG tablet  Daily     08/24/18 1825           Raeford RazorKohut, Jeromiah Ohalloran, MD 08/24/18 1910

## 2018-08-24 NOTE — ED Notes (Signed)
Patient verbalizes understanding of discharge instructions. Opportunity for questioning and answers were provided. Armband removed by staff, pt discharged from ED ambulatory to home.  

## 2018-08-24 NOTE — ED Triage Notes (Signed)
Pt here for SOB. Pt recently diagnosed with COVID last Tuesday. Pt states he needs an inhaler to breathe better.

## 2019-09-08 ENCOUNTER — Ambulatory Visit (HOSPITAL_COMMUNITY)
Admission: EM | Admit: 2019-09-08 | Discharge: 2019-09-08 | Disposition: A | Discharge status: Home / Self Care | Payer: Medicaid Other | Attending: Physician Assistant | Admitting: Physician Assistant

## 2019-09-08 ENCOUNTER — Other Ambulatory Visit: Payer: Self-pay

## 2019-09-08 ENCOUNTER — Encounter (HOSPITAL_COMMUNITY): Payer: Self-pay

## 2019-09-08 DIAGNOSIS — I10 Essential (primary) hypertension: Secondary | ICD-10-CM | POA: Diagnosis not present

## 2019-09-08 DIAGNOSIS — R030 Elevated blood-pressure reading, without diagnosis of hypertension: Secondary | ICD-10-CM

## 2019-09-08 MED ORDER — LOSARTAN POTASSIUM 25 MG PO TABS: 25.0000 mg | ORAL_TABLET | Freq: Every day | ORAL | 0 refills | Status: AC

## 2019-09-08 NOTE — ED Provider Notes (Signed)
[facility]   MRN: [id number] DOB: [birth year]  Subjective:   Jay Yoder is a 44 y.o. male presenting for new onset hypertension.  Patient has a PCP but has never been told he has high blood pressure need medication for it.  Admits very unhealthy diet.  Denies fever, headache, confusion, chest pain, heart racing, shortness of breath, nausea, vomiting, belly pain, hematuria, weakness.  No current facility-administered medications for this encounter.  Current Outpatient Medications:  .  cyclobenzaprine (FLEXERIL) 10 MG tablet, Take 0.5-1 tablets (5-10 mg total) by mouth 3 (three) times daily as needed for muscle spasms. (Patient not taking: Reported on 02/03/2018), Disp: 30 tablet, Rfl: 0 .  dexamethasone (DECADRON) 6 MG tablet, Take 1 tablet (6 mg total) by mouth daily., Disp: 9 tablet, Rfl: 0 .  dextromethorphan 15 MG/5ML syrup, Take 10 mLs (30 mg total) by mouth 4 (four) times daily as needed for cough. (Patient not taking: Reported on 04/26/2018), Disp: 120 mL, Rfl: 0 .  ondansetron (ZOFRAN ODT) 4 MG disintegrating tablet, Take 1 tablet (4 mg total) by mouth every 8 (eight) hours as needed for nausea or vomiting. (Patient not taking: Reported on 04/26/2018), Disp: 20 tablet, Rfl: 0 .  oseltamivir (TAMIFLU) 75 MG capsule, Take 1 capsule (75 mg total) by mouth every 12 (twelve) hours., Disp: 10 capsule, Rfl: 0 .  penicillin v potassium (VEETID) 500 MG tablet, Take 1 tablet (500 mg total) by mouth 3 (three) times daily., Disp: 30 tablet, Rfl: 0 .  predniSONE (STERAPRED UNI-PAK 21 TAB) 10 MG (21) TBPK tablet, Take by mouth daily. Take 6 tabs by mouth daily  for 2 days, then 5 tabs for 2 days, then 4 tabs for 2 days, then 3 tabs for 2 days, 2 tabs for 2 days, then 1 tab by mouth daily for 2 days (Patient not taking: Reported on 04/26/2018), Disp: 42 tablet, Rfl: 0   No Known Allergies  History reviewed. No pertinent past medical history.   History reviewed. No pertinent surgical  history.  Family History  Problem Relation Age of Onset  . Healthy Mother   . Healthy Father   . Hypertension Sister     Social History   Tobacco Use  . Smoking status: Never Smoker  . Smokeless tobacco: Never Used  Vaping Use  . Vaping Use: Never used  Substance Use Topics  . Alcohol use: No  . Drug use: No    ROS   Objective:   Vitals: BP (!) 167/103   Pulse 78   Temp 98.1 F (36.7 C) (Oral)   Resp 16   Ht 5\' 7"  (1.702 m)   Wt 185 lb (83.9 kg)   SpO2 99%   BMI 28.98 kg/m   BP Readings from Last 3 Encounters:  09/08/19 (!) 167/103  08/24/18 (!) 141/93  04/26/18 (!) 134/93   Physical Exam Constitutional:      General: He is not in acute distress.    Appearance: Normal appearance. He is well-developed. He is not ill-appearing, toxic-appearing or diaphoretic.  HENT:     Head: Normocephalic and atraumatic.     Right Ear: External ear normal.     Left Ear: External ear normal.     Nose: Nose normal.     Mouth/Throat:     Mouth: Mucous membranes are moist.     Pharynx: Oropharynx is clear.  Eyes:     General: No scleral icterus.       Right eye: No discharge.  Left eye: No discharge.     Extraocular Movements: Extraocular movements intact.     Conjunctiva/sclera: Conjunctivae normal.     Pupils: Pupils are equal, round, and reactive to light.  Cardiovascular:     Rate and Rhythm: Normal rate and regular rhythm.     Heart sounds: Normal heart sounds. No murmur heard.  No friction rub. No gallop.   Pulmonary:     Effort: Pulmonary effort is normal. No respiratory distress.     Breath sounds: Normal breath sounds. No stridor. No wheezing, rhonchi or rales.  Skin:    General: Skin is warm and dry.  Neurological:     General: No focal deficit present.     Mental Status: He is alert and oriented to person, place, and time.     Cranial Nerves: No cranial nerve deficit.     Motor: No weakness.     Coordination: Coordination normal.     Gait: Gait  normal.     Deep Tendon Reflexes: Reflexes normal.     Comments: Negative Romberg and pronator drift.  Psychiatric:        Mood and Affect: Mood normal.        Behavior: Behavior normal.        Thought Content: Thought content normal.        Judgment: Judgment normal.       Assessment and Plan :   PDMP not reviewed this encounter.  1. Essential hypertension   2. Elevated blood pressure reading     No signs of stroke, ACS or hypertensive crisis.  Emphasized need to start blood pressure medication regimen of losartan and titrate up to 50 mg once daily.  Reviewed dietary modifications, recommend establishing care with new PCP or follow-up with his current PCP for long-term management of his high blood pressure. Counseled patient on potential for adverse effects with medications prescribed/recommended today, ER and return-to-clinic precautions discussed, patient verbalized understanding.    Wallis Bamberg, [place] 09/08/19 [id number]

## 2019-09-08 NOTE — ED Triage Notes (Addendum)
Pt c/o dizziness started this morning at work. Pt denies any other symptoms. PT denies HTN hx. Pt denies blurred vision.

## 2019-09-08 NOTE — Discharge Instructions (Addendum)
For the 1st week take 1 tablet of losartan daily. Then increase thereafter to 2 tablets daily. Blood pressure goal should be to have the top number (systolic) 110-130.   For diabetes or elevated blood sugar, please make sure you are avoiding starchy, carbohydrate foods like pasta, breads, pastry, rice, potatoes, desserts. These foods can elevated your blood sugar. Also, avoid sodas, sweet teas, sugary beverages, fruit juices.  Drinking plain water will be much more helpful, try 64 ounces of water daily.  It is okay to flavor your water naturally by cutting cucumber, lemon, mint or lime, placing it in a picture with water and drinking it over a period of 2 to 3 days as long as it remains refrigerated.    For elevated blood pressure, make sure you are monitoring salt in your diet.  Do not eat restaurant foods and limit processed foods at home, prepare/cook your own foods at home.  Processed foods include things like frozen meals preseasoned meats and dinners, deli meats, canned foods as they are high in sodium/salt.  Make sure your pain attention to sodium labels on foods you by at the grocery store.  For seasoning you can use a brand called Mrs. Dash which includes a lot of salt free seasonings.  Salads - kale, spinach, cabbage, spring mix; use seeds like pumpkin seeds or sunflower seeds, almonds, walnuts or pecans; you can also use 1-2 hard boiled eggs in your salads Fruits - avocadoes, berries (blueberries, raspberries, blackberries), apples, oranges, pomegranate, pear; avoid eating bananas, grapes regularly Vegetables - aspargus, cauliflower, broccoli, green beans, brussel spouts, bell peppers; stay away from starchy vegetables like potatoes, carrots, peas  Regarding meat it is better to eat lean meats and limit your red meat including pork to once a week.  Wild caught fish, chicken breast are good options as they tend to be leaner sources of good protein.   DO NOT EAT ANY FOODS ON THIS LIST THAT YOU  ARE ALLERGIC TO.

## 2019-12-05 ENCOUNTER — Ambulatory Visit
Admission: RE | Admit: 2019-12-05 | Discharge: 2019-12-05 | Disposition: A | Discharge status: Home / Self Care | Payer: Medicaid Other | Source: Ambulatory Visit | Attending: Internal Medicine | Admitting: Internal Medicine

## 2019-12-05 ENCOUNTER — Other Ambulatory Visit: Payer: Self-pay | Admitting: Internal Medicine

## 2019-12-05 DIAGNOSIS — R059 Cough, unspecified: Secondary | ICD-10-CM

## 2021-03-07 IMAGING — DX PORTABLE CHEST - 1 VIEW
1 series · 1 of 1 positions shown · non-contrast
Comparison: 02/03/2018

CLINICAL DATA: Dyspnea. Shortness of breath. Positive for YB35W-AP

EXAM:
PORTABLE CHEST 1 VIEW

[chest]
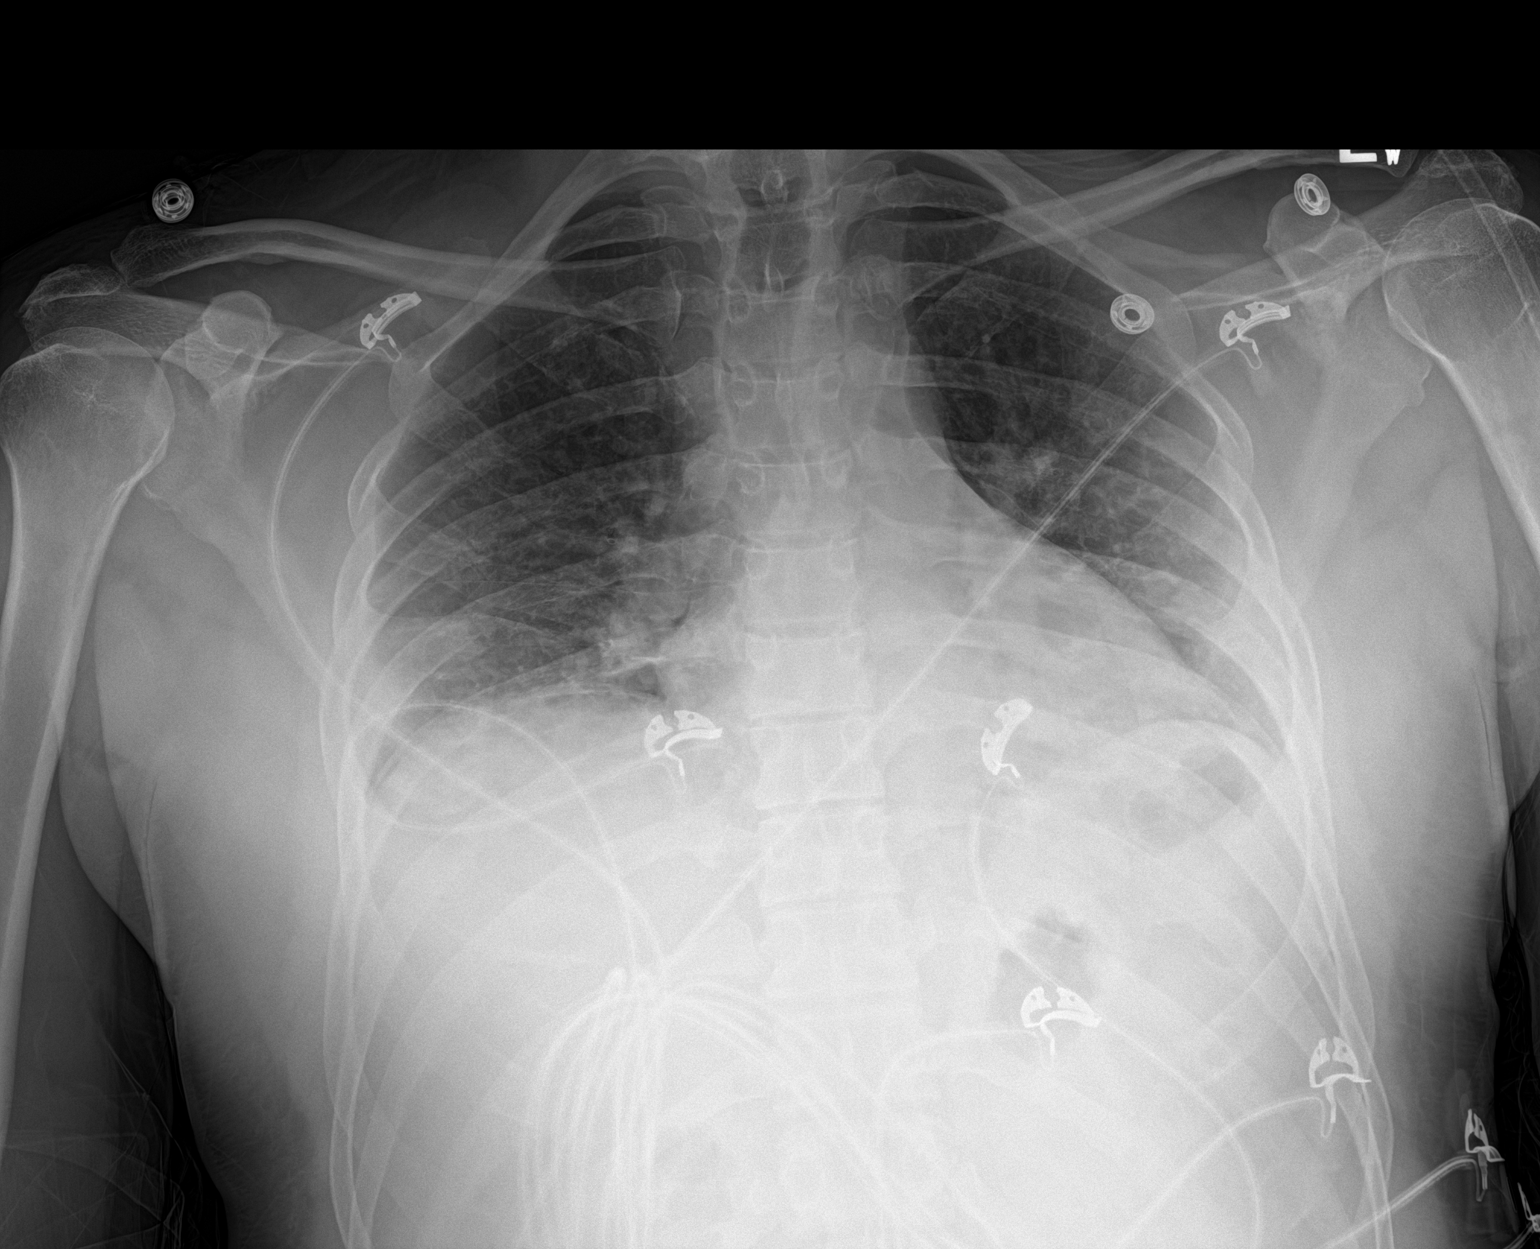

[1 of 1 positions shown; findings below may reference images not displayed]

FINDINGS: 8795 hours. Low lung volumes. Patchy peripheral airspace disease
noted bilaterally, left greater than right with a lower lung
predominance. The cardiopericardial silhouette is within normal
limits for size. The visualized bony structures of the thorax are
intact. Telemetry leads overlie the chest.
IMPRESSION: Patchy airspace disease, right greater than left with a peripheral
and lower lobe predominance. Imaging features compatible with
reported history of YB35W-AP infection.

## 2022-06-18 IMAGING — DX DG CHEST 2V
2 series · 2 of 2 positions shown · non-contrast
Comparison: Single-view of the chest 08/24/2018.

CLINICAL DATA: Cough for 1 year.

EXAM:
CHEST - 2 VIEW

[dg chest 2 view (1 of 2)]
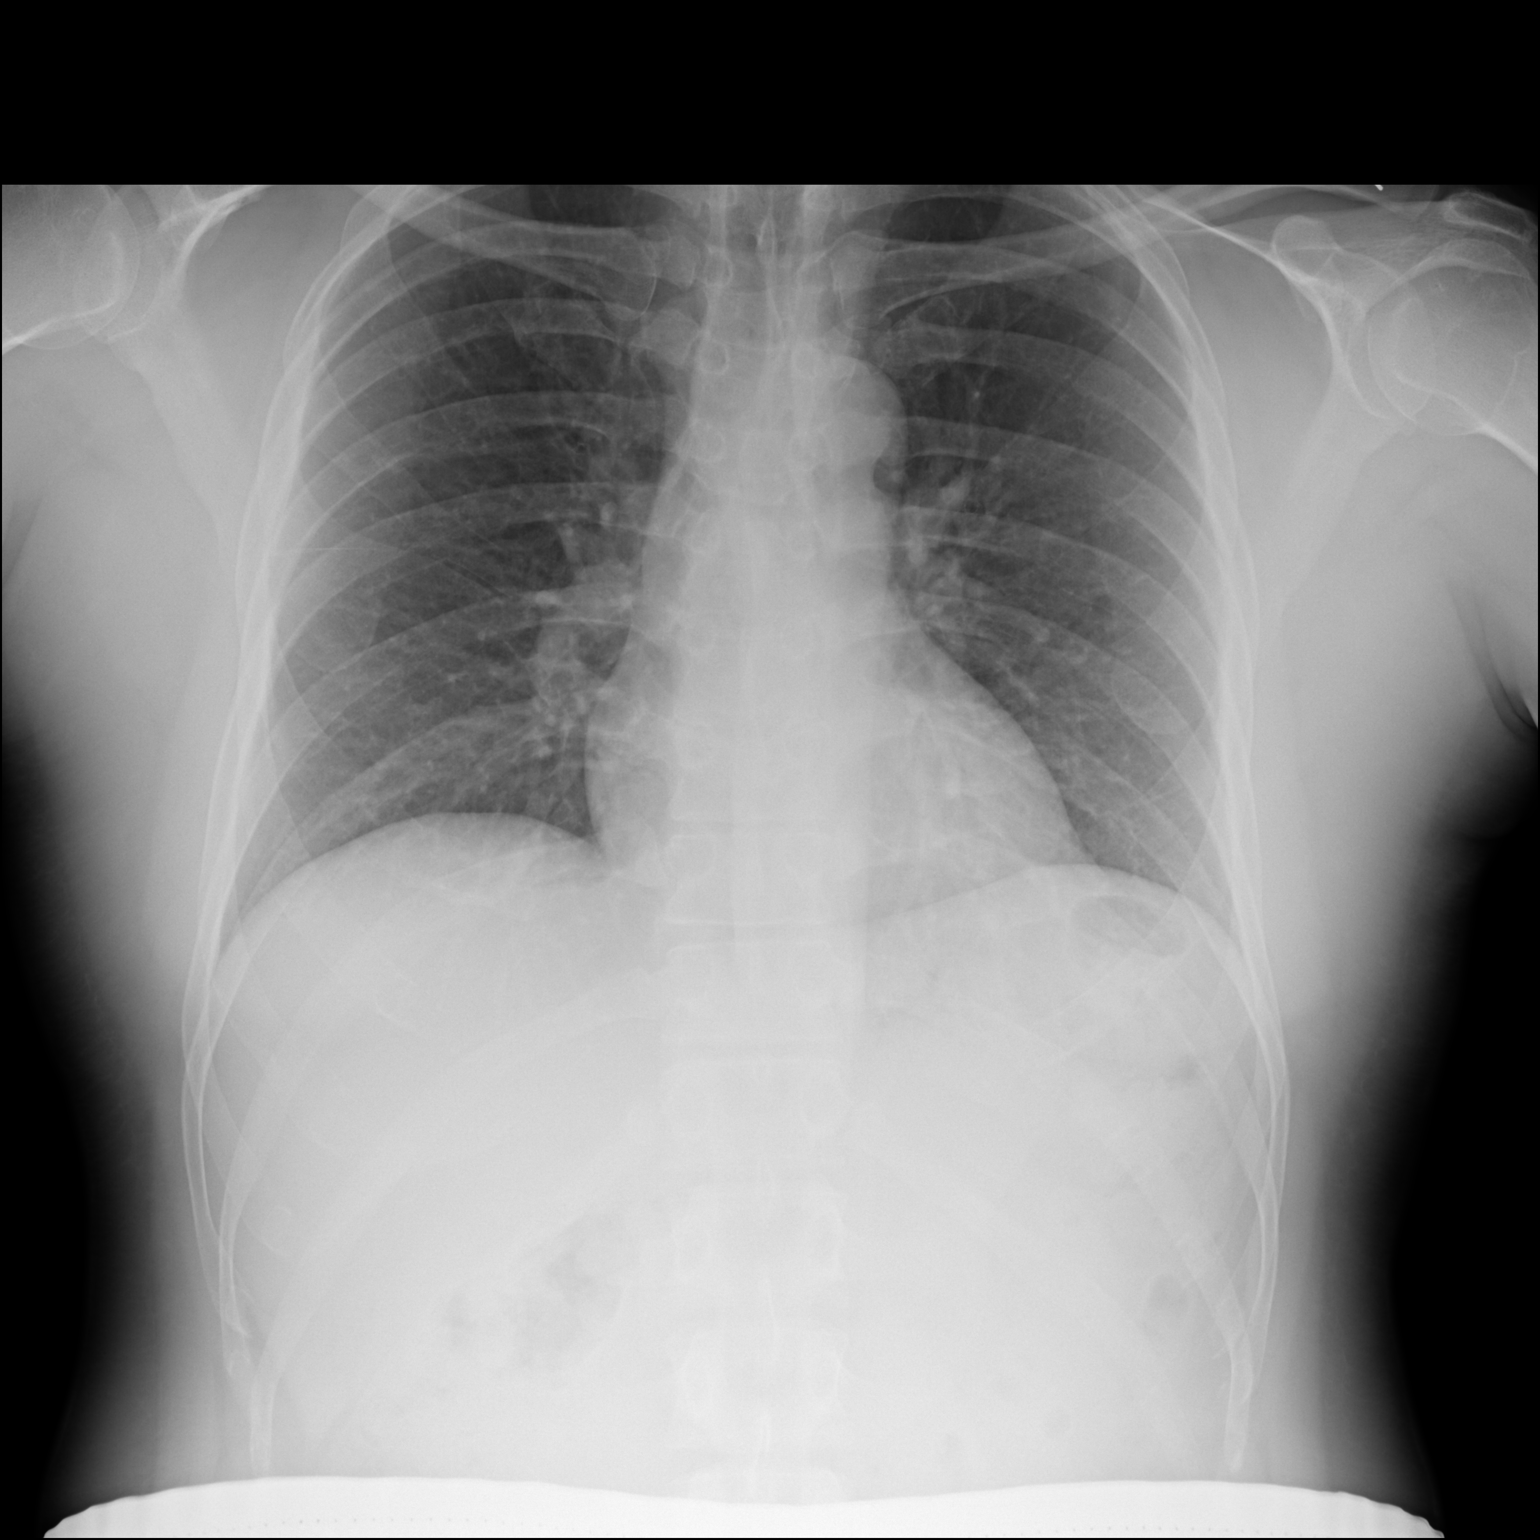

[dg chest 2 view (2 of 2)]
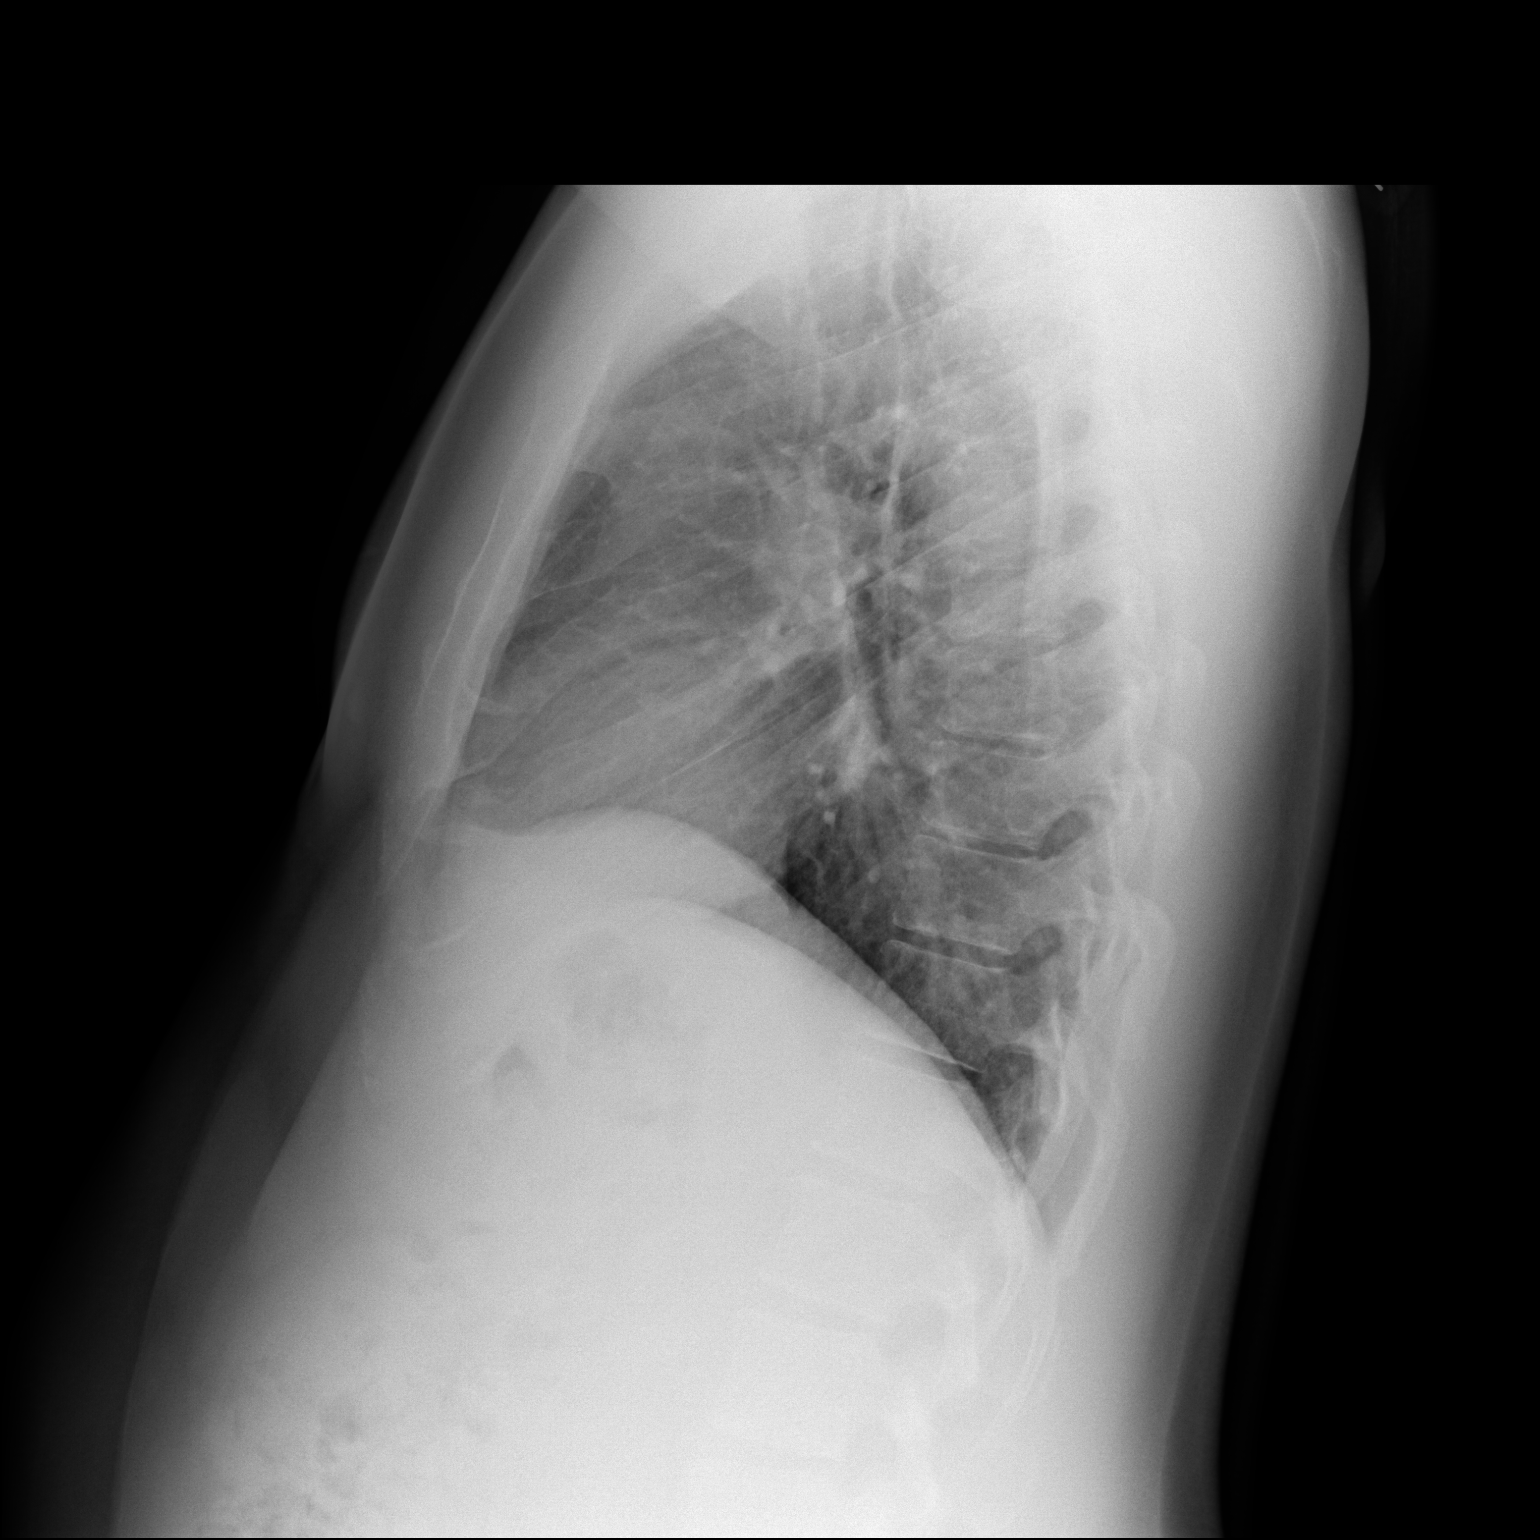

[2 of 2 positions shown; findings below may reference images not displayed]

FINDINGS: Lungs clear. Heart size normal. No pneumothorax or pleural fluid. No
bony abnormality.
IMPRESSION: Negative chest.

## 2022-10-12 ENCOUNTER — Encounter: Payer: Self-pay | Admitting: *Deleted

## 2022-10-12 NOTE — Progress Notes (Signed)
Pt attended 10/02/22 screening event where his b/p at recheck was 155/99. At the event, the pt did not document a PCP and did not identify any SDOH insecurities. Per pt during the event f/u call, his health screening form address reflects pt's work address at [employer]. 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 Andi Devon at "[facility]" and that he has access to the meds and healthcare he needs. Dr. Renae Gloss and her clinic do not document in [facility] to be able to verify last seen or future visit dates but pt assured caller he sees her "on a regular basis." Pt thanked the  [facility] health equity and mobile health depts for their help at the event, and for the f/u for all who attended. No additional health equity team support indicated at this time.

## 2023-11-24 ENCOUNTER — Ambulatory Visit (HOSPITAL_COMMUNITY)
Admission: EM | Admit: 2023-11-24 | Discharge: 2023-11-24 | Disposition: A | Discharge status: Home / Self Care | Attending: Nurse Practitioner | Admitting: Nurse Practitioner

## 2023-11-24 ENCOUNTER — Encounter (HOSPITAL_COMMUNITY): Payer: Self-pay

## 2023-11-24 DIAGNOSIS — J069 Acute upper respiratory infection, unspecified: Secondary | ICD-10-CM | POA: Diagnosis not present

## 2023-11-24 MED ORDER — CETIRIZINE-PSEUDOEPHEDRINE ER 5-120 MG PO TB12: 1.0000 | ORAL_TABLET | Freq: Every day | ORAL | 0 refills | Status: AC

## 2023-11-24 MED ORDER — METHYLPREDNISOLONE 4 MG PO TBPK: ORAL_TABLET | ORAL | 0 refills | Status: AC

## 2023-11-24 MED ORDER — DOXYCYCLINE HYCLATE 100 MG PO CAPS: 200.0000 mg | ORAL_CAPSULE | Freq: Once | ORAL | 0 refills | Status: AC

## 2023-11-24 MED ORDER — PSEUDOEPH-BROMPHEN-DM 30-2-10 MG/5ML PO SYRP: 10.0000 mL | ORAL_SOLUTION | Freq: Four times a day (QID) | ORAL | 0 refills | Status: AC | PRN

## 2023-11-24 MED ORDER — MUCINEX DM MAXIMUM STRENGTH 60-1200 MG PO TB12: 1.0000 | ORAL_TABLET | Freq: Two times a day (BID) | ORAL | 0 refills | Status: AC

## 2023-11-24 NOTE — Discharge Instructions (Addendum)
 Your symptoms are most likely due to a respiratory infection affecting your nose, throat, or lungs. Please take medications prescribed to you as directed. Several over-the-counter medicines can make you more comfortable. Acetaminophen  (Tylenol ) or ibuprofen  (Advil , Motrin ) can help with fever, body aches, or sore throat pain. Nasal sprays like Flonase may relieve nasal congestion, while saline sprays or rinses can be used often to keep your nose clear. It is important to drink plenty of fluids to stay hydrated and help thin mucus. Aim for urine that is pale yellow. Using a cool mist humidifier at home, inhaling steam several times a day, and avoiding cool or dry air may also ease congestion. Sleeping with your head elevated can reduce post-nasal drainage, and getting enough rest each night supports recovery. Remember to replace your toothbrush once you start feeling better. A cough may last for several weeks after a respiratory illness even when other symptoms resolve, as the airways remain irritated. As long as the cough gradually improves and no new concerning symptoms appear, this is part of normal healing. If your symptoms worsen or you develop new problems such as trouble breathing, chest pain, or high fever, go to the emergency room right away.

## 2023-11-24 NOTE — ED Triage Notes (Signed)
 Patient presents to the office for cough and congestion x 2 weeks. Denies any other symptoms.

## 2023-11-24 NOTE — ED Provider Notes (Signed)
 [facility]    CSN: [id number] Arrival date & time: 11/24/23  1045      History   Chief Complaint Chief Complaint  Patient presents with   Nasal Congestion   Cough    HPI Jay Yoder is a 48 y.o. male.   Discussed the use of AI scribe software for clinical note transcription with the patient, who gave verbal consent to proceed.   The patient presents with a 2-3 week history of coughing and congestion. He reports both nasal and chest congestion accompanied by a productive cough, describing frequent coughing throughout the day with postnasal drainage worsening at night. He denies wheezing, shortness of breath, fever, runny nose, sneezing, sore throat, nausea, vomiting, diarrhea, or headache. The patient does not smoke or vape. He takes daily medication for hypertension and has tried various over-the-counter medications with only partial relief of symptoms.  The following sections of the patient's history were reviewed and updated as appropriate: allergies, current medications, past family history, past medical history, past social history, past surgical history, and problem list.       History reviewed. No pertinent past medical history.  There are no active problems to display for this patient.   History reviewed. No pertinent surgical history.     Home Medications    Prior to Admission medications   Medication Sig Start Date End Date Taking? Authorizing Provider  brompheniramine-pseudoephedrine-DM 30-2-10 MG/5ML syrup Take 10 mLs by mouth every 6 (six) hours as needed (cough and congestion). 11/24/23  Yes Lester Crickenberger, Lucie, FNP  cetirizine-pseudoephedrine (ZYRTEC-D) 5-120 MG tablet Take 1 tablet by mouth daily with breakfast for 10 days. 11/24/23 12/04/23 Yes Iola Lucie, FNP  Dextromethorphan -guaiFENesin (MUCINEX DM MAXIMUM STRENGTH) 60-1200 MG TB12 Take 1 tablet by mouth 2 (two) times daily. 11/24/23  Yes Cheri Ayotte, FNP  doxycycline  (VIBRAMYCIN) 100 MG capsule Take 2 capsules (200 mg total) by mouth once for 1 dose. 11/24/23 11/24/23 Yes Iola Lucie, FNP  losartan  (COZAAR ) 25 MG tablet Take 1 tablet (25 mg total) by mouth daily. 09/08/19  Yes Christopher Savannah, PA-C  methylPREDNISolone (MEDROL DOSEPAK) 4 MG TBPK tablet Take as directed 11/24/23  Yes Iola Lucie, FNP    Family History Family History  Problem Relation Age of Onset   Healthy Mother    Healthy Father    Hypertension Sister     Social History Social History   Tobacco Use   Smoking status: Never   Smokeless tobacco: Never  Vaping Use   Vaping status: Never Used  Substance Use Topics   Alcohol use: No   Drug use: No     Allergies   Patient has no known allergies.   Review of Systems Review of Systems  Constitutional:  Negative for fever.  HENT:  Positive for congestion, postnasal drip (mostly at night) and rhinorrhea (occasionally). Negative for sneezing and sore throat.   Respiratory:  Positive for cough. Negative for shortness of breath and wheezing.        Chest congestion   Gastrointestinal:  Negative for diarrhea, nausea and vomiting.  Neurological:  Negative for headaches.  All other systems reviewed and are negative.    Physical Exam Triage Vital Signs ED Triage Vitals  Encounter Vitals Group     BP 11/24/23 1222 129/80     Girls Systolic BP Percentile --      Girls Diastolic BP Percentile --      Boys Systolic BP Percentile --      Boys Diastolic BP  Percentile --      Pulse Rate 11/24/23 1222 79     Resp 11/24/23 1222 18     Temp 11/24/23 1222 98.4 F (36.9 C)     Temp Source 11/24/23 1222 Oral     SpO2 11/24/23 1222 98 %     Weight --      Height --      Head Circumference --      Peak Flow --      Pain Score 11/24/23 1225 0     Pain Loc --      Pain Education --      Exclude from Growth Chart --    No data found.  Updated Vital Signs BP 129/80 (BP Location: Left Arm)   Pulse 79   Temp 98.4 F (36.9  C) (Oral)   Resp 18   SpO2 98%   Visual Acuity Right Eye Distance:   Left Eye Distance:   Bilateral Distance:    Right Eye Near:   Left Eye Near:    Bilateral Near:     Physical Exam Vitals reviewed.  Constitutional:      General: He is awake. He is not in acute distress.    Appearance: Normal appearance. He is well-developed. He is not ill-appearing, toxic-appearing or diaphoretic.  HENT:     Head: Normocephalic.     Right Ear: Tympanic membrane, ear canal and external ear normal. No drainage, swelling or tenderness. No middle ear effusion. Tympanic membrane is not erythematous.     Left Ear: Tympanic membrane, ear canal and external ear normal. No drainage, swelling or tenderness.  No middle ear effusion. Tympanic membrane is not erythematous.     Nose: Congestion present. No rhinorrhea.     Mouth/Throat:     Lips: Pink.     Mouth: Mucous membranes are moist.     Pharynx: No pharyngeal swelling, oropharyngeal exudate, posterior oropharyngeal erythema or uvula swelling.     Tonsils: No tonsillar exudate or tonsillar abscesses.  Eyes:     General: Vision grossly intact.     Conjunctiva/sclera: Conjunctivae normal.  Cardiovascular:     Rate and Rhythm: Normal rate.     Heart sounds: Normal heart sounds.  Pulmonary:     Effort: Pulmonary effort is normal. No tachypnea or respiratory distress.     Breath sounds: Normal breath sounds and air entry. No wheezing or rhonchi.  Musculoskeletal:        General: Normal range of motion.     Cervical back: Normal range of motion and neck supple.  Lymphadenopathy:     Cervical: No cervical adenopathy.  Skin:    General: Skin is warm and dry.  Neurological:     General: No focal deficit present.     Mental Status: He is alert and oriented to person, place, and time.  Psychiatric:        Behavior: Behavior is cooperative.      UC Treatments / Results  Labs (all labs ordered are listed, but only abnormal results are  displayed) Labs Reviewed - No data to display  EKG   Radiology No results found.  Procedures Procedures (including critical care time)  Medications Ordered in UC Medications - No data to display  Initial Impression / Assessment and Plan / UC Course  I have reviewed the triage vital signs and the nursing notes.  Pertinent labs & imaging results that were available during my care of the patient were reviewed by me and considered  in my medical decision making (see chart for details).     The patient presents with symptoms consistent with an upper respiratory infection. Exam is reassuring and no evidence of acute cardiopulmonary process is noted. Medications prescribed and supportive care recommended. Patient was advised to follow up with primary care if symptoms do not improve within one week or if new concerns arise. Instructions were given to seek emergency care if symptoms worsen, including shortness of breath, chest pain, persistent high fever, inability to tolerate fluids, or confusion.  Today's evaluation has revealed no signs of a dangerous process. Discussed diagnosis with patient and/or guardian. Patient and/or guardian aware of their diagnosis, possible red flag symptoms to watch out for and need for close follow up. Patient and/or guardian understands verbal and written discharge instructions. Patient and/or guardian comfortable with plan and disposition.  Patient and/or guardian has a clear mental status at this time, good insight into illness (after discussion and teaching) and has clear judgment to make decisions regarding their care  Documentation was completed with the aid of voice recognition software. Transcription may contain typographical errors.    Final Clinical Impressions(s) / UC Diagnoses   Final diagnoses:  Upper respiratory tract infection, unspecified type     Discharge Instructions      Your symptoms are most likely due to a respiratory infection  affecting your nose, throat, or lungs. Please take medications prescribed to you as directed. Several over-the-counter medicines can make you more comfortable. Acetaminophen  (Tylenol ) or ibuprofen  (Advil , Motrin ) can help with fever, body aches, or sore throat pain. Nasal sprays like Flonase may relieve nasal congestion, while saline sprays or rinses can be used often to keep your nose clear. It is important to drink plenty of fluids to stay hydrated and help thin mucus. Aim for urine that is pale yellow. Using a cool mist humidifier at home, inhaling steam several times a day, and avoiding cool or dry air may also ease congestion. Sleeping with your head elevated can reduce post-nasal drainage, and getting enough rest each night supports recovery. Remember to replace your toothbrush once you start feeling better. A cough may last for several weeks after a respiratory illness even when other symptoms resolve, as the airways remain irritated. As long as the cough gradually improves and no new concerning symptoms appear, this is part of normal healing. If your symptoms worsen or you develop new problems such as trouble breathing, chest pain, or high fever, go to the emergency room right away.     ED Prescriptions     Medication Sig Dispense Auth. Provider   doxycycline (VIBRAMYCIN) 100 MG capsule Take 2 capsules (200 mg total) by mouth once for 1 dose. 2 capsule Iola Lukes, FNP   brompheniramine-pseudoephedrine-DM 30-2-10 MG/5ML syrup Take 10 mLs by mouth every 6 (six) hours as needed (cough and congestion). 120 mL Tanise Russman, [place], FNP   cetirizine-pseudoephedrine (ZYRTEC-D) 5-120 MG tablet Take 1 tablet by mouth daily with breakfast for 10 days. 10 tablet Iola Lukes, FNP   Dextromethorphan -guaiFENesin (MUCINEX DM MAXIMUM STRENGTH) 60-1200 MG TB12 Take 1 tablet by mouth 2 (two) times daily. 20 tablet Iola Lukes, FNP   methylPREDNISolone (MEDROL DOSEPAK) 4 MG TBPK tablet Take as  directed 21 tablet Iola Lukes, FNP      PDMP not reviewed this encounter.   Iola Lukes, [place] 11/24/23 1304
